# Patient Record
Sex: Female | Born: 1960 | Race: Black or African American | Hispanic: No | Marital: Married | Smoking: Never smoker
Health system: Southern US, Community
[De-identification: ages and names within clinical notes are randomized; demographics above are authoritative.]

## PROBLEM LIST (undated history)

## (undated) DIAGNOSIS — I10 Essential (primary) hypertension: Secondary | ICD-10-CM

## (undated) DIAGNOSIS — E119 Type 2 diabetes mellitus without complications: Secondary | ICD-10-CM

---

## 1998-02-19 ENCOUNTER — Encounter: Admission: RE | Admit: 1998-02-19 | Discharge: 1998-02-19 | Payer: Self-pay | Admitting: Internal Medicine

## 1998-02-25 ENCOUNTER — Encounter: Admission: RE | Admit: 1998-02-25 | Discharge: 1998-02-25 | Payer: Self-pay | Admitting: Hematology and Oncology

## 1998-05-03 ENCOUNTER — Emergency Department (HOSPITAL_COMMUNITY): Admission: EM | Admit: 1998-05-03 | Discharge: 1998-05-03 | Payer: Self-pay | Admitting: Emergency Medicine

## 1998-05-05 ENCOUNTER — Encounter: Admission: RE | Admit: 1998-05-05 | Discharge: 1998-08-03 | Payer: Self-pay

## 1998-07-17 ENCOUNTER — Inpatient Hospital Stay (HOSPITAL_COMMUNITY): Admission: AD | Admit: 1998-07-17 | Discharge: 1998-07-17 | Payer: Self-pay | Admitting: Obstetrics & Gynecology

## 1998-07-18 ENCOUNTER — Ambulatory Visit (HOSPITAL_COMMUNITY): Admission: RE | Admit: 1998-07-18 | Discharge: 1998-07-18 | Payer: Self-pay | Admitting: *Deleted

## 1998-08-07 ENCOUNTER — Encounter: Admission: RE | Admit: 1998-08-07 | Discharge: 1998-08-07 | Payer: Self-pay | Admitting: Obstetrics

## 1998-08-07 ENCOUNTER — Encounter (HOSPITAL_COMMUNITY): Admission: RE | Admit: 1998-08-07 | Discharge: 1998-11-05 | Payer: Self-pay | Admitting: Obstetrics

## 1998-08-21 ENCOUNTER — Encounter: Admission: RE | Admit: 1998-08-21 | Discharge: 1998-08-21 | Payer: Self-pay | Admitting: Obstetrics

## 1998-08-22 ENCOUNTER — Ambulatory Visit (HOSPITAL_COMMUNITY): Admission: RE | Admit: 1998-08-22 | Discharge: 1998-08-22 | Payer: Self-pay | Admitting: *Deleted

## 1998-09-04 ENCOUNTER — Encounter: Admission: RE | Admit: 1998-09-04 | Discharge: 1998-09-04 | Payer: Self-pay | Admitting: Obstetrics

## 1998-09-12 ENCOUNTER — Encounter: Admission: RE | Admit: 1998-09-12 | Discharge: 1998-09-12 | Payer: Self-pay | Admitting: Obstetrics

## 1998-09-18 ENCOUNTER — Encounter: Admission: RE | Admit: 1998-09-18 | Discharge: 1998-09-18 | Payer: Self-pay | Admitting: Obstetrics

## 1998-09-23 ENCOUNTER — Ambulatory Visit (HOSPITAL_COMMUNITY): Admission: RE | Admit: 1998-09-23 | Discharge: 1998-09-23 | Payer: Self-pay | Admitting: *Deleted

## 1998-10-02 ENCOUNTER — Encounter: Admission: RE | Admit: 1998-10-02 | Discharge: 1998-10-02 | Payer: Self-pay | Admitting: Obstetrics

## 1998-10-06 ENCOUNTER — Encounter: Admission: RE | Admit: 1998-10-06 | Discharge: 1999-01-04 | Payer: Self-pay | Admitting: Obstetrics

## 1998-10-16 ENCOUNTER — Encounter: Admission: RE | Admit: 1998-10-16 | Discharge: 1998-10-16 | Payer: Self-pay | Admitting: Obstetrics

## 1998-11-06 ENCOUNTER — Encounter: Admission: RE | Admit: 1998-11-06 | Discharge: 1998-11-06 | Payer: Self-pay | Admitting: Obstetrics

## 1998-11-18 ENCOUNTER — Ambulatory Visit (HOSPITAL_COMMUNITY): Admission: RE | Admit: 1998-11-18 | Discharge: 1998-11-18 | Payer: Self-pay | Admitting: Obstetrics

## 1998-11-27 ENCOUNTER — Encounter (HOSPITAL_COMMUNITY): Admission: RE | Admit: 1998-11-27 | Discharge: 1999-02-09 | Payer: Self-pay | Admitting: Obstetrics

## 1998-11-27 ENCOUNTER — Encounter: Admission: RE | Admit: 1998-11-27 | Discharge: 1998-11-27 | Payer: Self-pay | Admitting: Obstetrics & Gynecology

## 1998-12-06 ENCOUNTER — Inpatient Hospital Stay (HOSPITAL_COMMUNITY): Admission: AD | Admit: 1998-12-06 | Discharge: 1998-12-06 | Payer: Self-pay | Admitting: *Deleted

## 1998-12-11 ENCOUNTER — Encounter: Admission: RE | Admit: 1998-12-11 | Discharge: 1998-12-11 | Payer: Self-pay | Admitting: Obstetrics & Gynecology

## 1998-12-25 ENCOUNTER — Encounter: Admission: RE | Admit: 1998-12-25 | Discharge: 1998-12-25 | Payer: Self-pay | Admitting: Obstetrics

## 1999-01-08 ENCOUNTER — Encounter: Admission: RE | Admit: 1999-01-08 | Discharge: 1999-01-08 | Payer: Self-pay | Admitting: Obstetrics

## 1999-01-22 ENCOUNTER — Inpatient Hospital Stay (HOSPITAL_COMMUNITY): Admission: AD | Admit: 1999-01-22 | Discharge: 1999-01-22 | Payer: Self-pay | Admitting: Obstetrics & Gynecology

## 1999-01-22 ENCOUNTER — Encounter: Admission: RE | Admit: 1999-01-22 | Discharge: 1999-01-22 | Payer: Self-pay | Admitting: Obstetrics

## 1999-01-22 ENCOUNTER — Encounter: Payer: Self-pay | Admitting: Obstetrics & Gynecology

## 1999-01-29 ENCOUNTER — Encounter: Admission: RE | Admit: 1999-01-29 | Discharge: 1999-01-29 | Payer: Self-pay | Admitting: Obstetrics

## 1999-02-05 ENCOUNTER — Inpatient Hospital Stay (HOSPITAL_COMMUNITY): Admission: AD | Admit: 1999-02-05 | Discharge: 1999-02-11 | Payer: Self-pay | Admitting: Obstetrics & Gynecology

## 1999-02-05 ENCOUNTER — Encounter: Admission: RE | Admit: 1999-02-05 | Discharge: 1999-02-05 | Payer: Self-pay | Admitting: Obstetrics

## 1999-02-13 ENCOUNTER — Inpatient Hospital Stay (HOSPITAL_COMMUNITY): Admission: AD | Admit: 1999-02-13 | Discharge: 1999-02-13 | Payer: Self-pay | Admitting: Obstetrics & Gynecology

## 1999-05-11 ENCOUNTER — Encounter: Admission: RE | Admit: 1999-05-11 | Discharge: 1999-05-11 | Payer: Self-pay | Admitting: Internal Medicine

## 1999-05-18 ENCOUNTER — Encounter: Admission: RE | Admit: 1999-05-18 | Discharge: 1999-05-18 | Payer: Self-pay | Admitting: Internal Medicine

## 1999-10-22 ENCOUNTER — Encounter: Admission: RE | Admit: 1999-10-22 | Discharge: 1999-10-22 | Payer: Self-pay | Admitting: Internal Medicine

## 2000-04-14 ENCOUNTER — Encounter: Admission: RE | Admit: 2000-04-14 | Discharge: 2000-04-14 | Payer: Self-pay | Admitting: Obstetrics

## 2000-09-05 ENCOUNTER — Encounter: Admission: RE | Admit: 2000-09-05 | Discharge: 2000-09-05 | Payer: Self-pay | Admitting: Internal Medicine

## 2000-09-05 ENCOUNTER — Ambulatory Visit (HOSPITAL_COMMUNITY): Admission: RE | Admit: 2000-09-05 | Discharge: 2000-09-05 | Payer: Self-pay | Admitting: Internal Medicine

## 2001-01-16 ENCOUNTER — Encounter: Admission: RE | Admit: 2001-01-16 | Discharge: 2001-01-16 | Payer: Self-pay

## 2001-03-06 ENCOUNTER — Encounter: Admission: RE | Admit: 2001-03-06 | Discharge: 2001-03-06 | Payer: Self-pay | Admitting: Internal Medicine

## 2001-03-30 ENCOUNTER — Encounter: Payer: Self-pay | Admitting: Internal Medicine

## 2001-03-30 ENCOUNTER — Encounter: Admission: RE | Admit: 2001-03-30 | Discharge: 2001-03-30 | Payer: Self-pay | Admitting: Internal Medicine

## 2001-03-30 ENCOUNTER — Ambulatory Visit (HOSPITAL_COMMUNITY): Admission: RE | Admit: 2001-03-30 | Discharge: 2001-03-30 | Payer: Self-pay | Admitting: Internal Medicine

## 2002-04-03 ENCOUNTER — Encounter: Admission: RE | Admit: 2002-04-03 | Discharge: 2002-04-03 | Payer: Self-pay | Admitting: Internal Medicine

## 2002-05-15 ENCOUNTER — Encounter: Admission: RE | Admit: 2002-05-15 | Discharge: 2002-05-15 | Payer: Self-pay | Admitting: Internal Medicine

## 2003-02-18 ENCOUNTER — Ambulatory Visit (HOSPITAL_COMMUNITY): Admission: RE | Admit: 2003-02-18 | Discharge: 2003-02-18 | Payer: Self-pay | Admitting: Obstetrics

## 2003-02-18 ENCOUNTER — Encounter: Payer: Self-pay | Admitting: Obstetrics

## 2016-02-09 ENCOUNTER — Other Ambulatory Visit: Payer: Self-pay | Admitting: Family

## 2016-02-09 DIAGNOSIS — Z1231 Encounter for screening mammogram for malignant neoplasm of breast: Secondary | ICD-10-CM

## 2016-02-17 ENCOUNTER — Ambulatory Visit
Admission: RE | Admit: 2016-02-17 | Discharge: 2016-02-17 | Disposition: A | Discharge status: Home / Self Care | Payer: BLUE CROSS/BLUE SHIELD | Source: Ambulatory Visit | Attending: Family | Admitting: Family

## 2016-02-17 DIAGNOSIS — Z1231 Encounter for screening mammogram for malignant neoplasm of breast: Secondary | ICD-10-CM

## 2016-07-14 ENCOUNTER — Encounter (INDEPENDENT_AMBULATORY_CARE_PROVIDER_SITE_OTHER): Payer: Self-pay

## 2017-02-25 ENCOUNTER — Other Ambulatory Visit: Payer: Self-pay | Admitting: Family

## 2017-02-25 DIAGNOSIS — Z1231 Encounter for screening mammogram for malignant neoplasm of breast: Secondary | ICD-10-CM

## 2017-03-18 ENCOUNTER — Ambulatory Visit
Admission: RE | Admit: 2017-03-18 | Discharge: 2017-03-18 | Disposition: A | Discharge status: Home / Self Care | Payer: BLUE CROSS/BLUE SHIELD | Source: Ambulatory Visit | Attending: Family | Admitting: Family

## 2017-03-18 DIAGNOSIS — Z1231 Encounter for screening mammogram for malignant neoplasm of breast: Secondary | ICD-10-CM

## 2017-07-29 ENCOUNTER — Other Ambulatory Visit: Payer: Self-pay | Admitting: Family Medicine

## 2017-07-29 ENCOUNTER — Other Ambulatory Visit (HOSPITAL_COMMUNITY)
Admission: RE | Admit: 2017-07-29 | Discharge: 2017-07-29 | Disposition: A | Discharge status: Home / Self Care | Payer: BLUE CROSS/BLUE SHIELD | Source: Ambulatory Visit | Attending: Family Medicine | Admitting: Family Medicine

## 2017-07-29 DIAGNOSIS — Z124 Encounter for screening for malignant neoplasm of cervix: Secondary | ICD-10-CM | POA: Diagnosis not present

## 2017-08-02 LAB — CYTOLOGY - PAP
DIAGNOSIS: UNDETERMINED — AB
HPV (WINDOPATH): NOT DETECTED

## 2017-10-17 ENCOUNTER — Ambulatory Visit (INDEPENDENT_AMBULATORY_CARE_PROVIDER_SITE_OTHER): Payer: BLUE CROSS/BLUE SHIELD

## 2017-10-17 ENCOUNTER — Ambulatory Visit (HOSPITAL_COMMUNITY)
Admission: EM | Admit: 2017-10-17 | Discharge: 2017-10-17 | Disposition: A | Discharge status: Home / Self Care | Payer: BLUE CROSS/BLUE SHIELD | Attending: Family Medicine | Admitting: Family Medicine

## 2017-10-17 ENCOUNTER — Other Ambulatory Visit: Payer: Self-pay

## 2017-10-17 ENCOUNTER — Encounter (HOSPITAL_COMMUNITY): Payer: Self-pay | Admitting: Emergency Medicine

## 2017-10-17 DIAGNOSIS — S338XXA Sprain of other parts of lumbar spine and pelvis, initial encounter: Secondary | ICD-10-CM | POA: Diagnosis not present

## 2017-10-17 HISTORY — DX: Essential (primary) hypertension: I10

## 2017-10-17 HISTORY — DX: Type 2 diabetes mellitus without complications: E11.9

## 2017-10-17 MED ORDER — NAPROXEN 375 MG PO TABS: 375.0000 mg | ORAL_TABLET | Freq: Two times a day (BID) | ORAL | 0 refills | Status: AC

## 2017-10-17 NOTE — ED Provider Notes (Signed)
[facility]    CSN: [id number] Arrival date & time: 10/17/17  1236     History   Chief Complaint Chief Complaint  Patient presents with  . Abdominal Pain    HPI Kerby Noraheodora Henry-Reichow is a 56 y.o. female.   56 year old moderate to severe obesity complaining of pain after having a bowel movement. The pain is located primarily in the saddle area/genital perineal anal area. Arising from a seated position she has pain in the same area difficult to walk. Denies trauma. Denies fall or other injury. She states her job requires her to stand for several hours during the day working for [employer].      Past Medical History:  Diagnosis Date  . Diabetes mellitus without complication (HCC)   . Hypertension     There are no active problems to display for this patient.   Past Surgical History:  Procedure Laterality Date  . CESAREAN SECTION      OB History    No data available       Home Medications    Prior to Admission medications   Medication Sig Start Date End Date Taking? Authorizing Provider  amLODipine (NORVASC) 10 MG tablet Take 10 mg by mouth daily.   Yes [provider]  METFORMIN HCL PO Take by mouth.   Yes [provider]  naproxen (NAPROSYN) 375 MG tablet Take 1 tablet (375 mg total) by mouth 2 (two) times daily. 10/17/17   Hayden RasmussenMabe, Ellionna Buckbee, NP    Family History Family History  Problem Relation Age of Onset  . Stroke Mother   . Heart failure Mother   . Diabetes Mother   . Cancer Father   . Diabetes Child   . Breast cancer Neg Hx     Social History Social History   Tobacco Use  . Smoking status: Never Smoker  Substance Use Topics  . Alcohol use: No    Frequency: Never  . Drug use: Not on file     Allergies   Patient has no known allergies.   Review of Systems Review of Systems  Constitutional: Positive for activity change. Negative for chills and fever.  HENT: Negative.   Respiratory: Negative.   Cardiovascular:  Negative.   Genitourinary: Negative for dysuria.  Musculoskeletal: Positive for myalgias.       As per HPI  Skin: Negative for color change, pallor and rash.  Neurological: Positive for weakness.  All other systems reviewed and are negative.    Physical Exam Triage Vital Signs ED Triage Vitals [10/17/17 1341]  Enc Vitals Group     BP (!) 172/93     Pulse Rate 90     Resp 18     Temp 98.2 F (36.8 C)     Temp src      SpO2 100 %     Weight      Height      Head Circumference      Peak Flow      Pain Score 5     Pain Loc      Pain Edu?      Excl. in GC?    No data found.  Updated Vital Signs BP (!) 172/93   Pulse 90   Temp 98.2 F (36.8 C)   Resp 18   SpO2 100%   Visual Acuity Right Eye Distance:   Left Eye Distance:   Bilateral Distance:    Right Eye Near:   Left Eye Near:    Bilateral  Near:     Physical Exam  Constitutional: She is oriented to person, place, and time. She appears well-developed and well-nourished. No distress.  HENT:  Head: Normocephalic and atraumatic.  Eyes: EOM are normal.  Neck: Normal range of motion. Neck supple.  Cardiovascular: Normal rate and regular rhythm.  Pulmonary/Chest: Effort normal and breath sounds normal.  Abdominal:  Obese, soft, nontender. No rebound or guarding.  Genitourinary:  Genitourinary Comments: Christina, R and present Patient was placed in lithotomy position. Palpation of the initial bones produces some tenderness. No tenderness to the abductor muscles. Having the patient roll over to the left lateral recumbency position and palpating the deep bony or structures also produces minor discomfort/tenderness. Difficult to evaluate due to body habitus.  Lymphadenopathy:    She has no cervical adenopathy.  Neurological: She is alert and oriented to person, place, and time. No cranial nerve deficit.  Skin: Skin is warm and dry.  Nursing note and vitals reviewed.    UC Treatments / Results  Labs (all labs  ordered are listed, but only abnormal results are displayed) Labs Reviewed - No data to display  EKG  EKG Interpretation None       Radiology Dg Pelvis 1-2 Views  Result Date: 10/17/2017 CLINICAL DATA:  Sudden onset mid pelvic pain, worse with movement. EXAM: PELVIS - 1-2 VIEW COMPARISON:  None. FINDINGS: There is no evidence of pelvic fracture or diastasis. Possible erosive changes along the left lateral acetabulum and lateral femoral head. Degenerative changes of the sacroiliac joints, pubic symphysis, and bilateral hip joints. IMPRESSION: Possible erosive changes along the left lateral acetabulum and lateral femoral head. Recommend dedicated left hip x-rays for further evaluation. Electronically Signed   By: Obie DredgeWilliam T Derry M.D.   On: 10/17/2017 14:51    Procedures Procedures (including critical care time)  Medications Ordered in UC Medications - No data to display   Initial Impression / Assessment and Plan / UC Course  I have reviewed the triage vital signs and the nursing notes.  Pertinent labs & imaging results that were available during my care of the patient were reviewed by me and considered in my medical decision making (see chart for details).   likely a pelvic ligament strain. I recommended limited work with [employer]. Persistent walking and/or lifting may exacerbate symptoms. Apply ice initially.    Final Clinical Impressions(s) / UC Diagnoses   Final diagnoses:  Pelvic sprain, initial encounter    ED Discharge Orders        Ordered    naproxen (NAPROSYN) 375 MG tablet  2 times daily     10/17/17 1515       Controlled Substance Prescriptions [place] Controlled Substance Registry consulted? Not Applicable   Hayden RasmussenMabe, Aireana Ryland, NP 10/17/17 1517

## 2017-10-17 NOTE — Discharge Instructions (Signed)
Ice for first 2-3 days, then heat. Medication as directed

## 2017-10-17 NOTE — ED Triage Notes (Signed)
Pt states she had a painful BM, soft stool today, afterwards she started having lower pelvic pain. Denies issues with urination.

## 2018-01-17 ENCOUNTER — Encounter (HOSPITAL_COMMUNITY): Payer: Self-pay | Admitting: Emergency Medicine

## 2018-01-17 ENCOUNTER — Ambulatory Visit (HOSPITAL_COMMUNITY)
Admission: EM | Admit: 2018-01-17 | Discharge: 2018-01-17 | Disposition: A | Discharge status: Home / Self Care | Payer: BLUE CROSS/BLUE SHIELD | Attending: Family Medicine | Admitting: Family Medicine

## 2018-01-17 DIAGNOSIS — N930 Postcoital and contact bleeding: Secondary | ICD-10-CM

## 2018-01-17 DIAGNOSIS — N941 Unspecified dyspareunia: Secondary | ICD-10-CM

## 2018-01-17 DIAGNOSIS — Z113 Encounter for screening for infections with a predominantly sexual mode of transmission: Secondary | ICD-10-CM

## 2018-01-17 DIAGNOSIS — E119 Type 2 diabetes mellitus without complications: Secondary | ICD-10-CM | POA: Insufficient documentation

## 2018-01-17 DIAGNOSIS — I10 Essential (primary) hypertension: Secondary | ICD-10-CM | POA: Diagnosis not present

## 2018-01-17 DIAGNOSIS — Z202 Contact with and (suspected) exposure to infections with a predominantly sexual mode of transmission: Secondary | ICD-10-CM | POA: Diagnosis present

## 2018-01-17 DIAGNOSIS — Z79899 Other long term (current) drug therapy: Secondary | ICD-10-CM | POA: Diagnosis not present

## 2018-01-17 DIAGNOSIS — Z7984 Long term (current) use of oral hypoglycemic drugs: Secondary | ICD-10-CM | POA: Insufficient documentation

## 2018-01-17 NOTE — ED Provider Notes (Signed)
[facility]    CSN: [id number] Arrival date & time: 01/17/18  1752     History   Chief Complaint Chief Complaint  Patient presents with  . Exposure to STD    HPI Jill Rich is a 57 y.o. female.   57 year old female with history of HTN, DM comes in for STD screening.  States that her partner experience some "tingling sensation" of the lower abdomen, and is worried about STDs and requested her to come in.  She is currently asymptomatic.  Denies fever, chills, night sweats.  Denies abdominal pain, nausea, vomiting.  Denies urinary symptoms such as frequency, dysuria, hematuria.  Denies vaginal discharge, itching/pain, spotting.  She has been postmenopausal for many years.  She is sexually active with one female partner, with condom use. States had some bleeding and pain after intercourse for the first time in 10 years, which has since resolved. States partner worries that that is a sign of STD.       Past Medical History:  Diagnosis Date  . Diabetes mellitus without complication (HCC)   . Hypertension     There are no active problems to display for this patient.   Past Surgical History:  Procedure Laterality Date  . CESAREAN SECTION      OB History    No data available       Home Medications    Prior to Admission medications   Medication Sig Start Date End Date Taking? Authorizing Provider  amLODipine (NORVASC) 10 MG tablet Take 10 mg by mouth daily.    [provider]  METFORMIN HCL PO Take by mouth.    [provider]  naproxen (NAPROSYN) 375 MG tablet Take 1 tablet (375 mg total) by mouth 2 (two) times daily. 10/17/17   Hayden Rasmussen, NP    Family History Family History  Problem Relation Age of Onset  . Stroke Mother   . Heart failure Mother   . Diabetes Mother   . Cancer Father   . Diabetes Child   . Breast cancer Neg Hx     Social History Social History   Tobacco Use  . Smoking status: Never Smoker  Substance  Use Topics  . Alcohol use: No    Frequency: Never  . Drug use: Not on file     Allergies   Patient has no known allergies.   Review of Systems Review of Systems  Reason unable to perform ROS: See HPI as above.     Physical Exam Triage Vital Signs ED Triage Vitals [01/17/18 1814]  Enc Vitals Group     BP (!) 163/95     Pulse Rate 89     Resp 18     Temp 98.1 F (36.7 C)     Temp Source Oral     SpO2 100 %     Weight      Height      Head Circumference      Peak Flow      Pain Score 0     Pain Loc      Pain Edu?      Excl. in GC?    No data found.  Updated Vital Signs BP (!) 163/95 (BP Location: Left Arm)   Pulse 89   Temp 98.1 F (36.7 C) (Oral)   Resp 18   SpO2 100%   Physical Exam  Constitutional: She is oriented to person, place, and time. She appears well-developed and well-nourished. No distress.  HENT:  Head: Normocephalic and atraumatic.  Eyes: Conjunctivae are normal. Pupils are equal, round, and reactive to light.  Neurological: She is alert and oriented to person, place, and time.     UC Treatments / Results  Labs (all labs ordered are listed, but only abnormal results are displayed) Labs Reviewed  CERVICOVAGINAL ANCILLARY ONLY    EKG  EKG Interpretation None       Radiology No results found.  Procedures Procedures (including critical care time)  Medications Ordered in UC Medications - No data to display   Initial Impression / Assessment and Plan / UC Course  I have reviewed the triage vital signs and the nursing notes.  Pertinent labs & imaging results that were available during my care of the patient were reviewed by me and considered in my medical decision making (see chart for details).    Cytology sent, patient will be contacted with any positive results that require additional treatment. Patient to refrain from sexual activity for the next 7 days. Return precautions given.    Final Clinical Impressions(s) / UC  Diagnoses   Final diagnoses:  Screen for STD (sexually transmitted disease)    ED Discharge Orders    None        Belinda FisherYu, Amy V, PA-C 01/17/18 1845

## 2018-01-17 NOTE — ED Triage Notes (Signed)
Pt here requesting STD screen; denies sx

## 2018-01-17 NOTE — Discharge Instructions (Addendum)
Cytology sent, you will be contacted with any positive results that requires further treatment. Refrain from sexual activity and alcohol use for the next 7 days. Monitor for any worsening of symptoms, fever, abdominal pain, nausea, vomiting, to follow up for reevaluation. ° °

## 2018-01-18 LAB — CERVICOVAGINAL ANCILLARY ONLY
Bacterial vaginitis: NEGATIVE
CANDIDA VAGINITIS: NEGATIVE
CHLAMYDIA, DNA PROBE: NEGATIVE
NEISSERIA GONORRHEA: NEGATIVE
Trichomonas: NEGATIVE

## 2018-02-21 ENCOUNTER — Encounter (HOSPITAL_COMMUNITY): Payer: Self-pay | Admitting: Emergency Medicine

## 2018-02-21 ENCOUNTER — Ambulatory Visit (HOSPITAL_COMMUNITY)
Admission: EM | Admit: 2018-02-21 | Discharge: 2018-02-21 | Disposition: A | Discharge status: Home / Self Care | Payer: BLUE CROSS/BLUE SHIELD | Attending: Family Medicine | Admitting: Family Medicine

## 2018-02-21 DIAGNOSIS — M7918 Myalgia, other site: Secondary | ICD-10-CM

## 2018-02-21 DIAGNOSIS — R062 Wheezing: Secondary | ICD-10-CM

## 2018-02-21 MED ORDER — PREDNISONE 10 MG (21) PO TBPK: ORAL_TABLET | Freq: Every day | ORAL | 0 refills | Status: AC

## 2018-02-21 NOTE — ED Provider Notes (Signed)
[facility]   [id number] 02/21/18 Arrival Time: 1001  ASSESSMENT & PLAN:  1. Musculoskeletal pain   2. Wheezing     Meds ordered this encounter  Medications  . predniSONE (STERAPRED UNI-PAK 21 TAB) 10 MG (21) TBPK tablet    Sig: Take by mouth daily. Take as directed.    Dispense:  21 tablet    Refill:  0   Short trial of prednisone. Will watch her blood sugars. Reports they are usually controlled. Discussed her MSK complaints may be weight related. She plans to f/u with PCP to discuss weight loss and possibly a nutritionist referral. May f/u here as needed.  Wheezing likely related to seasonal allergies. May try OTC allergy med daily if she desires.  Reviewed expectations re: course of current medical issues. Questions answered. Outlined signs and symptoms indicating need for more acute intervention. Patient verbalized understanding. After Visit Summary given.  SUBJECTIVE: History from: patient. Jill Rich is a 57 y.o. female who reports intermittent mild to moderate pain of her knees, pelvis, groin described as aching. No specific radiation from areas of discomfort. Onset: gradual, several months ago. Injury/trama: no. Relieved by: nothing in particular. "Just comes on randomly." Works at [employer] and notices more after working. Worsened by: certain movements when present. Associated symptoms: none reported. Extremity sensation changes or weakness: none. Self treatment: occasional Tylenol that helps. No abdominal pain or urinary symptoms.  Also thinks she is wheezing. On/off over the past week. Nasal congestion and sneezing. Afebrile. No SOB. Seems worse at night. No OTC treatment.  ROS: As per HPI.   OBJECTIVE:  Vitals:   02/21/18 1018  BP: (!) 164/93  Pulse: 79  Resp: 16  Temp: 97.6 F (36.4 C)  TempSrc: Oral  SpO2: 99%    General appearance: alert; no distress; morbid obesity Extremities: no cyanosis or edema; symmetrical with no gross  deformities; no pain with extremity movements while in office today. LE ROM: normal. Abd: obese which limits exam CV: normal extremity capillary refill Skin: warm and dry Neurologic: normal gait; normal symmetric reflexes in all extremities; normal sensation in all extremities Psychological: alert and cooperative; normal mood and affect  No Known Allergies  Past Medical History:  Diagnosis Date  . Diabetes mellitus without complication (HCC)   . Hypertension    Social History   Socioeconomic History  . Marital status: Married    Spouse name: Not on file  . Number of children: Not on file  . Years of education: Not on file  . Highest education level: Not on file  Occupational History  . Not on file  Social Needs  . Financial resource strain: Not on file  . Food insecurity:    Worry: Not on file    Inability: Not on file  . Transportation needs:    Medical: Not on file    Non-medical: Not on file  Tobacco Use  . Smoking status: Never Smoker  Substance and Sexual Activity  . Alcohol use: No    Frequency: Never  . Drug use: Not on file  . Sexual activity: Not on file  Lifestyle  . Physical activity:    Days per week: Not on file    Minutes per session: Not on file  . Stress: Not on file  Relationships  . Social connections:    Talks on phone: Not on file    Gets together: Not on file    Attends religious service: Not on file    Active member  of club or organization: Not on file    Attends meetings of clubs or organizations: Not on file    Relationship status: Not on file  . Intimate partner violence:    Fear of current or ex partner: Not on file    Emotionally abused: Not on file    Physically abused: Not on file    Forced sexual activity: Not on file  Other Topics Concern  . Not on file  Social History Narrative  . Not on file   Family History  Problem Relation Age of Onset  . Stroke Mother   . Heart failure Mother   . Diabetes Mother   . Cancer Father    . Diabetes Child   . Breast cancer Neg Hx    Past Surgical History:  Procedure Laterality Date  . CESAREAN SECTION        Mardella LaymanHagler, Azyria Osmon, MD 02/21/18 [phone]

## 2018-02-21 NOTE — ED Triage Notes (Signed)
Pt here for groin pain and URI sx

## 2018-03-21 ENCOUNTER — Other Ambulatory Visit: Payer: Self-pay | Admitting: Family Medicine

## 2018-03-21 DIAGNOSIS — Z1231 Encounter for screening mammogram for malignant neoplasm of breast: Secondary | ICD-10-CM

## 2018-03-22 ENCOUNTER — Ambulatory Visit
Admission: RE | Admit: 2018-03-22 | Discharge: 2018-03-22 | Disposition: A | Discharge status: Home / Self Care | Payer: BLUE CROSS/BLUE SHIELD | Source: Ambulatory Visit | Attending: Family Medicine | Admitting: Family Medicine

## 2018-03-22 DIAGNOSIS — Z1231 Encounter for screening mammogram for malignant neoplasm of breast: Secondary | ICD-10-CM

## 2018-04-17 ENCOUNTER — Other Ambulatory Visit: Payer: Self-pay | Admitting: Family Medicine

## 2018-04-17 ENCOUNTER — Ambulatory Visit
Admission: RE | Admit: 2018-04-17 | Discharge: 2018-04-17 | Disposition: A | Discharge status: Home / Self Care | Payer: BLUE CROSS/BLUE SHIELD | Source: Ambulatory Visit | Attending: Family Medicine | Admitting: Family Medicine

## 2018-04-17 DIAGNOSIS — R053 Chronic cough: Secondary | ICD-10-CM

## 2018-04-17 DIAGNOSIS — R05 Cough: Secondary | ICD-10-CM

## 2018-07-02 ENCOUNTER — Emergency Department (HOSPITAL_COMMUNITY): Payer: BLUE CROSS/BLUE SHIELD

## 2018-07-02 ENCOUNTER — Encounter (HOSPITAL_COMMUNITY): Payer: Self-pay | Admitting: Oncology

## 2018-07-02 ENCOUNTER — Other Ambulatory Visit: Payer: Self-pay

## 2018-07-02 ENCOUNTER — Emergency Department (HOSPITAL_COMMUNITY)
Admission: EM | Admit: 2018-07-02 | Discharge: 2018-07-02 | Disposition: A | Discharge status: Home / Self Care | Payer: BLUE CROSS/BLUE SHIELD | Attending: Emergency Medicine | Admitting: Emergency Medicine

## 2018-07-02 DIAGNOSIS — Z7984 Long term (current) use of oral hypoglycemic drugs: Secondary | ICD-10-CM | POA: Diagnosis not present

## 2018-07-02 DIAGNOSIS — Z7982 Long term (current) use of aspirin: Secondary | ICD-10-CM | POA: Diagnosis not present

## 2018-07-02 DIAGNOSIS — I10 Essential (primary) hypertension: Secondary | ICD-10-CM | POA: Diagnosis not present

## 2018-07-02 DIAGNOSIS — M160 Bilateral primary osteoarthritis of hip: Secondary | ICD-10-CM | POA: Insufficient documentation

## 2018-07-02 DIAGNOSIS — M25559 Pain in unspecified hip: Secondary | ICD-10-CM | POA: Diagnosis present

## 2018-07-02 DIAGNOSIS — E119 Type 2 diabetes mellitus without complications: Secondary | ICD-10-CM | POA: Diagnosis not present

## 2018-07-02 DIAGNOSIS — Z79899 Other long term (current) drug therapy: Secondary | ICD-10-CM | POA: Diagnosis not present

## 2018-07-02 MED ORDER — TRAMADOL HCL 50 MG PO TABS: 50.0000 mg | ORAL_TABLET | Freq: Four times a day (QID) | ORAL | 0 refills | Status: AC | PRN

## 2018-07-02 MED ORDER — IBUPROFEN 800 MG PO TABS: 800.0000 mg | ORAL_TABLET | Freq: Three times a day (TID) | ORAL | 0 refills | Status: DC | PRN

## 2018-07-02 MED ORDER — KETOROLAC TROMETHAMINE 60 MG/2ML IM SOLN
60.0000 mg | Freq: Once | INTRAMUSCULAR | Status: AC
  Administered 2018-07-02: 60 mg via INTRAMUSCULAR
  Filled 2018-07-02: qty 2

## 2018-07-02 MED ORDER — TRAMADOL HCL 50 MG PO TABS
50.0000 mg | ORAL_TABLET | Freq: Once | ORAL | Status: AC
  Administered 2018-07-02: 50 mg via ORAL
  Filled 2018-07-02: qty 1

## 2018-07-02 NOTE — Discharge Instructions (Signed)
Return here as needed.  Follow-up with the orthopedic provided.

## 2018-07-02 NOTE — ED Provider Notes (Signed)
[facility] EMERGENCY DEPARTMENT Provider Note   CSN: [id number] Arrival date & time: 07/02/18  0321     History   Chief Complaint Chief Complaint  Patient presents with  . Pelvic Pain    HPI Jill Rich is a 57 y.o. female.  HPI Patient presents to the emergency department with his hip and groin pain that started 2 years ago and she said several flareups of this pain during that timeframe.  She states that tonight she was unable to sleep due to the discomfort.  She states that certain movements make the pain worse.  She states that she was told that she does have arthritis in both hips that is pretty significant.  Patient states that she does not have any back pain.  Patient states she does not lose bowel or bladder function with any of the symptoms.  The patient denies chest pain, shortness of breath, headache,blurred vision, neck pain, fever, cough, weakness, numbness, dizziness, anorexia, edema, abdominal pain, nausea, vomiting, diarrhea, rash, back pain, dysuria, hematemesis, bloody stool, near syncope, or syncope. Past Medical History:  Diagnosis Date  . Diabetes mellitus without complication (HCC)   . Hypertension     There are no active problems to display for this patient.   Past Surgical History:  Procedure Laterality Date  . CESAREAN SECTION       OB History   None      Home Medications    Prior to Admission medications   Medication Sig Start Date End Date Taking? Authorizing Provider  albuterol (PROVENTIL HFA;VENTOLIN HFA) 108 (90 Base) MCG/ACT inhaler Inhale 1-2 puffs into the lungs every 6 (six) hours as needed for wheezing or shortness of breath.   Yes [provider]  amLODipine (NORVASC) 10 MG tablet Take 10 mg by mouth daily.   Yes [provider]  aspirin EC 81 MG tablet Take 81 mg by mouth daily.   Yes [provider]  carvedilol (COREG) 25 MG tablet Take 25 mg by mouth 2 (two) times daily with a  meal.   Yes [provider]  metFORMIN (GLUCOPHAGE) 500 MG tablet Take 1,000 mg by mouth 2 (two) times daily with a meal.   Yes [provider]  Multiple Vitamin (MULTIVITAMIN WITH MINERALS) TABS tablet Take 1 tablet by mouth daily.   Yes [provider]  triamterene-hydrochlorothiazide (MAXZIDE-25) 37.5-25 MG tablet Take 1 tablet by mouth daily.   Yes [provider]  naproxen (NAPROSYN) 375 MG tablet Take 1 tablet (375 mg total) by mouth 2 (two) times daily. Patient not taking: Reported on 07/02/2018 10/17/17   Hayden Rasmussen, NP  predniSONE (STERAPRED UNI-PAK 21 TAB) 10 MG (21) TBPK tablet Take by mouth daily. Take as directed. Patient not taking: Reported on 07/02/2018 02/21/18   Mardella Layman, MD    Family History Family History  Problem Relation Age of Onset  . Stroke Mother   . Heart failure Mother   . Diabetes Mother   . Cancer Father   . Diabetes Child   . Breast cancer Neg Hx     Social History Social History   Tobacco Use  . Smoking status: Never Smoker  . Smokeless tobacco: Never Used  Substance Use Topics  . Alcohol use: No    Frequency: Never  . Drug use: Not on file     Allergies   Patient has no known allergies.   Review of Systems Review of Systems  All other systems negative except as documented  in the HPI. All pertinent positives and negatives as reviewed in the HPI. Physical Exam Updated Vital Signs BP 129/78   Pulse 85   Temp 98.5 F (36.9 C) (Oral)   Resp 19   Ht 5\' 5"  (1.651 m)   SpO2 100%   Physical Exam  Constitutional: She is oriented to person, place, and time. She appears well-developed and well-nourished. No distress.  HENT:  Head: Normocephalic and atraumatic.  Mouth/Throat: Oropharynx is clear and moist.  Eyes: Pupils are equal, round, and reactive to light.  Neck: Normal range of motion. Neck supple.  Cardiovascular: Normal rate, regular rhythm and normal heart sounds. Exam reveals no gallop and no  friction rub.  No murmur heard. Pulmonary/Chest: Effort normal and breath sounds normal. No respiratory distress. She has no wheezes.  Abdominal: Soft. Bowel sounds are normal. She exhibits no distension. There is no tenderness.  Musculoskeletal:       Right hip: She exhibits tenderness. She exhibits normal range of motion, normal strength, no bony tenderness, no swelling and no deformity.       Left hip: She exhibits tenderness. She exhibits normal range of motion, normal strength and no bony tenderness.  Neurological: She is alert and oriented to person, place, and time. She exhibits normal muscle tone. Coordination normal.  Skin: Skin is warm and dry. Capillary refill takes less than 2 seconds. No rash noted. No erythema.  Psychiatric: She has a normal mood and affect. Her behavior is normal.  Nursing note and vitals reviewed.    ED Treatments / Results  Labs (all labs ordered are listed, but only abnormal results are displayed) Labs Reviewed - No data to display  EKG None  Radiology Dg Pelvis 1-2 Views  Result Date: 07/02/2018 CLINICAL DATA:  Pelvic pain for 1 year which acutely worsened last night. No known injury. EXAM: PELVIS - 1-2 VIEW COMPARISON:  Single-view of the pelvis 10/17/2017. FINDINGS: No acute bony or joint abnormality is identified. Moderate bilateral hip osteoarthritis appears advanced for age. There is also degenerative disease about the symphysis pubis and SI joints. No focal bony lesion is identified. Soft tissues appear normal. IMPRESSION: No acute abnormality or change compared to the prior exam. Advanced for age appearing bilateral hip osteoarthritis. Degenerative disease about the SI joints and symphysis pubis also noted. Electronically Signed   By: Drusilla Kanner M.D.   On: 07/02/2018 07:52    Procedures Procedures (including critical care time)  Medications Ordered in ED Medications  ketorolac (TORADOL) injection 60 mg (60 mg Intramuscular Given 07/02/18  0732)  traMADol (ULTRAM) tablet 50 mg (50 mg Oral Given 07/02/18 0732)     Initial Impression / Assessment and Plan / ED Course  I have reviewed the triage vital signs and the nursing notes.  Pertinent labs & imaging results that were available during my care of the patient were reviewed by me and considered in my medical decision making (see chart for details).    Patient may have a lower back component but no neurological deficits noted on exam.  She has normal reflexes and normal gait.  She does have pain in the hips with movements.  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  Advised the patient she will need to follow-up with orthopedics told to return here as needed.  Patient agrees the plan and all questions were answered.  Final Clinical Impressions(s) / ED Diagnoses   Final diagnoses:  None    ED Discharge Orders    None  Charlestine Night, PA-C 07/02/18 0814    Melene Plan, DO 07/02/18 [phone]

## 2018-07-02 NOTE — ED Notes (Signed)
Patient transported to X-ray 

## 2018-07-02 NOTE — ED Triage Notes (Signed)
Pt reports pelvic/groin pain x 1 year.  States tonight it got progressively worse. Pt has been seen on one other occasion for similar pain and was told it was arthritis.  Pt states the pain is so bad it is interfering with ambulation.

## 2018-07-13 ENCOUNTER — Ambulatory Visit (INDEPENDENT_AMBULATORY_CARE_PROVIDER_SITE_OTHER): Payer: BLUE CROSS/BLUE SHIELD | Admitting: Orthopaedic Surgery

## 2018-07-13 ENCOUNTER — Encounter (INDEPENDENT_AMBULATORY_CARE_PROVIDER_SITE_OTHER): Payer: Self-pay | Admitting: Orthopaedic Surgery

## 2018-07-13 DIAGNOSIS — R102 Pelvic and perineal pain: Secondary | ICD-10-CM

## 2018-07-13 MED ORDER — TIZANIDINE HCL 4 MG PO TABS: 4.0000 mg | ORAL_TABLET | Freq: Three times a day (TID) | ORAL | 0 refills | Status: AC | PRN

## 2018-07-13 NOTE — Progress Notes (Signed)
Office Visit Note   Patient: Jill Rich           Date of Birth: 05-17-1961           MRN: [id number] Visit Date: 07/13/2018              Requested by: No referring provider defined for this encounter. PCP: System, Provider Not In   Assessment & Plan: Visit Diagnoses:  1. Pelvic pain     Plan: At this point to me her plain films do not show severe arthritis of her hips.  Her exam does show significant pelvic pain but not so much of the hip joint itself.  My lateral pain is in the groin and pubic symphysis area.  At this point given the fact that she is been in the ER multiple times for complaints of this type of pain from an orthopedic standpoint I would like to obtain an MRI of her pelvis to assess the cartilage and other structures around the pubis and both hips.  She is apparently seeing some type of clinic tomorrow where there is a nutritionist.  She does not know what her blood glucose levels are.  She is significantly obese and that she needs to work on weight loss.  She is had a chronic cough and multiple aches and pains of all her joints.  I recommended she also see her GYN physician as well.  I will at least try small muscle relaxant and have her continue ibuprofen.  When she has the MRI scheduled she will call us for follow-up appointment.  Follow-Up Instructions: The patient will call for follow-up appointment what she knows the date of her MRI.  Orders:  No orders of the defined types were placed in this encounter.  Meds ordered this encounter  Medications  . tiZANidine (ZANAFLEX) 4 MG tablet    Sig: Take 1 tablet (4 mg total) by mouth every 8 (eight) hours as needed for muscle spasms.    Dispense:  40 tablet    Refill:  0      Procedures: No procedures performed   Clinical Data: No additional findings.   Subjective: Chief Complaint  Patient presents with  . Right Hip - Pain  . Left Hip - Pain  The patient is referred from the emergency room where  she has been several times due to some type of vague pelvic and groin pain.  The ER sent her this way because on the radiology report of her hips they said that there was some mild arthritic changes.  The patient comes in today saying that she is overweight and she does not know her weight.  She is a diabetic but does not want know what her blood sugars are running.  She has had a chronic cough and hurts in all of her joints.  She said her pain is more in the pubis and groin area.  She feels weak overall.  HPI  Review of Systems She currently denies any fever or chills.  She does report sometimes shortness of breath and occasional chest pain.  Objective: Vital Signs: There were no vitals taken for this visit.  Physical Exam She is alert and oriented in no acute distress but is obviously obese. Ortho Exam Examination of both hips show that the move fluidly with internal and external rotation with no severe pain at all.  Her pain seems to be more on the pubis and groin area than it does actually hip joint itself. Specialty  Comments:  No specialty comments available.  Imaging: No results found. Independent review of her pelvis films from December of last year and then September of this year showed just some very mild arthritic changes of the left and right hips but is very mild.  The joint space still well-maintained and it does not appear that she has moderate to severe arthritis but I would call this more just mild arthritis.  PMFS History: There are no active problems to display for this patient.  Past Medical History:  Diagnosis Date  . Diabetes mellitus without complication (HCC)   . Hypertension     Family History  Problem Relation Age of Onset  . Stroke Mother   . Heart failure Mother   . Diabetes Mother   . Cancer Father   . Diabetes Child   . Breast cancer Neg Hx     Past Surgical History:  Procedure Laterality Date  . CESAREAN SECTION     Social History    Occupational History  . Not on file  Tobacco Use  . Smoking status: Never Smoker  . Smokeless tobacco: Never Used  Substance and Sexual Activity  . Alcohol use: No    Frequency: Never  . Drug use: Not on file  . Sexual activity: Not on file

## 2018-07-14 ENCOUNTER — Other Ambulatory Visit (INDEPENDENT_AMBULATORY_CARE_PROVIDER_SITE_OTHER): Payer: Self-pay

## 2018-07-14 ENCOUNTER — Encounter: Payer: BLUE CROSS/BLUE SHIELD | Attending: Family Medicine | Admitting: *Deleted

## 2018-07-14 DIAGNOSIS — R102 Pelvic and perineal pain: Secondary | ICD-10-CM

## 2018-07-14 DIAGNOSIS — Z713 Dietary counseling and surveillance: Secondary | ICD-10-CM | POA: Diagnosis not present

## 2018-07-14 DIAGNOSIS — E119 Type 2 diabetes mellitus without complications: Secondary | ICD-10-CM | POA: Diagnosis not present

## 2018-07-14 NOTE — Progress Notes (Signed)
Diabetes Self-Management Education  Visit Type: First/Initial  Appt. Start Time: 1000 Appt. End Time: 1130  07/14/2018  [name], identified by name and date of birth, is a 57 y.o. female with a diagnosis of Diabetes: Type 2. Patient states she has 3 grown sons and one of them has had type 1 Diabetes since he was 58 years old. So she has had extensive diabetes education with him. She works 4 hour days with [employer] Sundays - Thursdays from around noon to 4 PM. She had a personal trainer for a year last year, lost 60 pounds and felt terrific. 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 She is not testing her BG anymore and has resumed regular sodas as she keeps hearing diet sodas are "bad".   ASSESSMENT  There were no vitals taken for this visit. There is no height or weight on file to calculate BMI.  Diabetes Self-Management Education - 07/14/18 1027      Visit Information   Visit Type  First/Initial      Initial Visit   Diabetes Type  Type 2    Are you currently following a meal plan?  No    Are you taking your medications as prescribed?  Yes    Date Diagnosed  about 2 years ago      Health Coping   How would you rate your overall health?  Fair      Psychosocial Assessment   Patient Belief/Attitude about Diabetes  Motivated to manage diabetes    Self-care barriers  None    Self-management support  Family    Other persons present  Patient    Patient Concerns  Nutrition/Meal planning;Glycemic Control;Weight Control    Special Needs  None    Preferred Learning Style  No preference indicated    Learning Readiness  Contemplating    How often do you need to have someone help you when you read instructions, pamphlets, or other written materials from your doctor or pharmacy?  1 - Never    What is the last grade level you completed in school?  college plus      Pre-Education Assessment   Patient understands the diabetes disease and  treatment process.  Demonstrates understanding / competency    Patient understands incorporating nutritional management into lifestyle.  Needs Review    Patient undertands incorporating physical activity into lifestyle.  Needs Review    Patient understands using medications safely.  Demonstrates understanding / competency    Patient understands monitoring blood glucose, interpreting and using results  Needs Review    Patient understands prevention, detection, and treatment of acute complications.  Demonstrates understanding / competency    Patient understands prevention, detection, and treatment of chronic complications.  Demonstrates understanding / competency    Patient understands how to develop strategies to address psychosocial issues.  Needs Review    Patient understands how to develop strategies to promote health/change behavior.  Needs Review      Complications   Last HgB A1C per patient/outside source  7.5 %    How often do you check your blood sugar?  Patient declines    Have you had a dilated eye exam in the past 12 months?  No    Have you had a dental exam in the past 12 months?  Yes    Are you checking your feet?  No      Dietary Intake   Breakfast  11 AM  on way to work: whatever is in Therapist, artreftigerator meat on toast sandwich OR     Snack (morning)  works 12-4 PM, may have pack of crackers at work    Kinder Morgan EnergySnack (afternoon)  occasionally small bag of chips OR cookies OR fresh fruit including grapes    Dinner  meat, starch and occasionally vegetables    Snack (evening)  not usually    Beverage(s)  coffee with 5 tsp sugar, regular 20 oz soda at work, water      Exercise   Exercise Type  ADL's   stands at work for 4 hours a day     Patient Education   Previous Diabetes Education  Yes (please comment)    Disease state   Factors that contribute to the development of diabetes    Nutrition management   Role of diet in the treatment of diabetes and the relationship between the three main  macronutrients and blood glucose level;Carbohydrate counting;Reviewed blood glucose goals for pre and post meals and how to evaluate the patients' food intake on their blood glucose level.    Physical activity and exercise   Role of exercise on diabetes management, blood pressure control and cardiac health.;Helped patient identify appropriate exercises in relation to his/her diabetes, diabetes complications and other health issue.    Monitoring  Identified appropriate SMBG and/or A1C goals.    Psychosocial adjustment  Role of stress on diabetes    Personal strategies to promote health  Helped patient develop diabetes management plan for (enter comment)      Individualized Goals (developed by patient)   Nutrition  Follow meal plan discussed    Physical Activity  Exercise 3-5 times per week    Medications  take my medication as prescribed    Monitoring   test blood glucose pre and post meals as discussed      Post-Education Assessment   Patient understands the diabetes disease and treatment process.  Demonstrates understanding / competency    Patient understands incorporating nutritional management into lifestyle.  Demonstrates understanding / competency    Patient undertands incorporating physical activity into lifestyle.  Demonstrates understanding / competency    Patient understands using medications safely.  Demonstrates understanding / competency    Patient understands monitoring blood glucose, interpreting and using results  Demonstrates understanding / competency    Patient understands prevention, detection, and treatment of acute complications.  Demonstrates understanding / competency    Patient understands prevention, detection, and treatment of chronic complications.  Demonstrates understanding / competency    Patient understands how to develop strategies to address psychosocial issues.  Demonstrates understanding / competency    Patient understands how to develop strategies to promote  health/change behavior.  Demonstrates understanding / competency      Outcomes   Expected Outcomes  Demonstrated interest in learning. Expect positive outcomes    Future DMSE  4-6 wks    Program Status  Completed       Individualized Plan for Diabetes Self-Management Training:   Learning Objective:  Patient will have a greater understanding of diabetes self-management. Patient education plan is to attend individual and/or group sessions per assessed needs and concerns.   Plan:   Patient Instructions  Plan:  Aim for 3 Carb Choices per meal (45 grams) +/- 1 either way  Aim for 0-2 Carbs per snack if hungry  Include lean protein in moderation with your meals and snacks Consider reading food labels for Total Carbohydrate of foods Consider  increasing your activity level  as tolerated (We have discussed some hiking opportunities locally) Consider checking BG at alternate times per day including after some meals and after increased activity  Continue taking medication as directed by MD  Expected Outcomes:  Demonstrated interest in learning. Expect positive outcomes  Education material provided: A1C conversion sheet, Meal plan card, Support group flyer and Carbohydrate counting sheet  Her son may be interested in the activities of the Type 1 Support Group?  If problems or questions, patient to contact team via:  Phone  Future DSME appointment: 4-6 wks

## 2018-07-14 NOTE — Patient Instructions (Signed)
Plan:  Aim for 3 Carb Choices per meal (45 grams) +/- 1 either way  Aim for 0-2 Carbs per snack if hungry  Include lean protein in moderation with your meals and snacks Consider reading food labels for Total Carbohydrate of foods Consider  increasing your activity level  as tolerated (We have discussed some hiking opportunities locally) Consider checking BG at alternate times per day including after some meals and after increased activity  Continue taking medication as directed by MD

## 2018-07-27 ENCOUNTER — Ambulatory Visit
Admission: RE | Admit: 2018-07-27 | Discharge: 2018-07-27 | Disposition: A | Discharge status: Home / Self Care | Payer: BLUE CROSS/BLUE SHIELD | Source: Ambulatory Visit | Attending: Orthopaedic Surgery | Admitting: Orthopaedic Surgery

## 2018-07-27 DIAGNOSIS — R102 Pelvic and perineal pain: Secondary | ICD-10-CM

## 2018-08-01 ENCOUNTER — Encounter (INDEPENDENT_AMBULATORY_CARE_PROVIDER_SITE_OTHER): Payer: Self-pay | Admitting: Orthopaedic Surgery

## 2018-08-01 ENCOUNTER — Other Ambulatory Visit (INDEPENDENT_AMBULATORY_CARE_PROVIDER_SITE_OTHER): Payer: Self-pay

## 2018-08-01 ENCOUNTER — Ambulatory Visit (INDEPENDENT_AMBULATORY_CARE_PROVIDER_SITE_OTHER): Payer: BLUE CROSS/BLUE SHIELD | Admitting: Orthopaedic Surgery

## 2018-08-01 DIAGNOSIS — R102 Pelvic and perineal pain: Secondary | ICD-10-CM

## 2018-08-01 MED ORDER — DICLOFENAC SODIUM 1 % TD GEL: 2.0000 g | Freq: Four times a day (QID) | TRANSDERMAL | 3 refills | Status: AC

## 2018-08-01 MED ORDER — IBUPROFEN 800 MG PO TABS: 800.0000 mg | ORAL_TABLET | Freq: Three times a day (TID) | ORAL | 0 refills | Status: DC | PRN

## 2018-08-01 NOTE — Progress Notes (Signed)
The patient is coming in to go over an MRI of her pelvis.  She is having pelvic pain in the front of her pubis area and in the SI joints with normal-appearing hips on my exam and x-rays.  She is someone who is very pleasant but is moderately obese.  She says her sister has the same thing.  She denies any change in bowel bladder function.  On exam her pain is on bilateral SI joints to palpation and stress.  She also has significant pain around the pubis.  Her hip exam is normal.  MRI is independently reviewed with her and I shared with her the report.  She does have cystic changes on either side of the pubis which can be indicative of instability.  She has significant sclerosis and bilateral SI joints.  Both hips appear normal.  From my standpoint is not a surgery that she needs.  Certainly weight loss can help.  I do feel that she would benefit from a guided physical therapy program for pelvic pain and we can send her to [facility] for this.  We will refill her 800 mg ibuprofen and try Voltaren gel as well.  We will see her back in about 6 weeks to see how she is done.  All question concerns were answered and addressed.

## 2018-08-11 ENCOUNTER — Ambulatory Visit: Payer: BLUE CROSS/BLUE SHIELD | Admitting: *Deleted

## 2018-08-18 ENCOUNTER — Encounter: Payer: BLUE CROSS/BLUE SHIELD | Attending: Family Medicine | Admitting: *Deleted

## 2018-08-18 DIAGNOSIS — Z713 Dietary counseling and surveillance: Secondary | ICD-10-CM | POA: Insufficient documentation

## 2018-08-18 DIAGNOSIS — E119 Type 2 diabetes mellitus without complications: Secondary | ICD-10-CM | POA: Diagnosis not present

## 2018-08-18 NOTE — Progress Notes (Signed)
Diabetes Self-Management Education  Visit Type:  Follow-up  Appt. Start Time: 0800 Appt. End Time: 0830  08/18/2018  [name], identified by name and date of birth, is a 57 y.o. female with a diagnosis of Diabetes: Type 2.  Patient states she is pleased with her improved health status with improved blood pressure, cholesterol levels and A1c is down to 7.1%. She has increased her walking to 3-5 days a week for 30 minutes walking around [facility].  ASSESSMENT  There were no vitals taken for this visit. There is no height or weight on file to calculate BMI.   Diabetes Self-Management Education - 08/18/18 1254      Complications   Last HgB A1C per patient/outside source  --   7.1     Exercise   Exercise Type  Light (walking / raking leaves)    How many days per week to you exercise?  --   4   How many minutes per day do you exercise?  --   30     Patient Self-Evaluation of Goals - Patient rates self as meeting previously set goals (% of time)   Nutrition  >75%    Physical Activity  >75%    Medications  >75%    Monitoring  >75%    Problem Solving  >75%    Reducing Risk  >75%      Outcomes   Program Status  Completed      Subsequent Visit   Since your last visit have you had your blood pressure checked?  Yes    Is your most recent blood pressure lower, unchanged, or higher since your last visit?  Lower    Since your last visit have you experienced any weight changes?  Loss    Weight Loss (lbs)  --   4 POUNDS   Since your last visit, are you checking your blood glucose at least once a day?  Yes       Learning Objective:  Patient will have a greater understanding of diabetes self-management. Patient education plan is to attend individual and/or group sessions per assessed needs and concerns.   Plan:   Patient Instructions  Plan:  Aim for 3 Carb Choices per meal (45 grams) +/- 1 either way  Aim for 0-2 Carbs per snack if hungry  Include  lean protein in moderation with your meals and snacks Consider reading food labels for Total Carbohydrate of foods Continue with your activity level  as tolerated (We have discussed some hiking opportunities locally) Continue checking BG at alternate times per day including after some meals and after increased activity  Continue taking medication as directed by MD  Expected Outcomes:  Demonstrated interest in learning. Expect positive outcomes  Education material provided: No new handouts today  If problems or questions, patient to contact team via:  Phone  Future DSME appointment: - 3-4 months

## 2018-08-18 NOTE — Patient Instructions (Addendum)
Plan:  Aim for 3 Carb Choices per meal (45 grams) +/- 1 either way  Aim for 0-2 Carbs per snack if hungry  Include lean protein in moderation with your meals and snacks Consider reading food labels for Total Carbohydrate of foods Continue with your activity level  as tolerated (We have discussed some hiking opportunities locally) Continue checking BG at alternate times per day including after some meals and after increased activity  Continue taking medication as directed by MD

## 2018-08-28 ENCOUNTER — Other Ambulatory Visit (INDEPENDENT_AMBULATORY_CARE_PROVIDER_SITE_OTHER): Payer: Self-pay | Admitting: Orthopaedic Surgery

## 2018-09-12 ENCOUNTER — Ambulatory Visit (INDEPENDENT_AMBULATORY_CARE_PROVIDER_SITE_OTHER): Payer: BLUE CROSS/BLUE SHIELD | Admitting: Orthopaedic Surgery

## 2018-09-26 ENCOUNTER — Ambulatory Visit (HOSPITAL_COMMUNITY)
Admission: EM | Admit: 2018-09-26 | Discharge: 2018-09-26 | Discharge status: Against Medical Advice | Payer: BLUE CROSS/BLUE SHIELD | Attending: Family Medicine | Admitting: Family Medicine

## 2018-09-26 NOTE — ED Notes (Signed)
Patient called with no response, registration states patient left.

## 2018-11-10 ENCOUNTER — Ambulatory Visit: Payer: BLUE CROSS/BLUE SHIELD | Admitting: *Deleted

## 2019-01-01 ENCOUNTER — Ambulatory Visit (HOSPITAL_COMMUNITY)
Admission: EM | Admit: 2019-01-01 | Discharge: 2019-01-01 | Disposition: A | Discharge status: Home / Self Care | Payer: BLUE CROSS/BLUE SHIELD | Attending: Family Medicine | Admitting: Family Medicine

## 2019-01-01 ENCOUNTER — Other Ambulatory Visit: Payer: Self-pay

## 2019-01-01 ENCOUNTER — Encounter (HOSPITAL_COMMUNITY): Payer: Self-pay

## 2019-01-01 DIAGNOSIS — J069 Acute upper respiratory infection, unspecified: Secondary | ICD-10-CM

## 2019-01-01 NOTE — ED Provider Notes (Signed)
[facility]    CSN: [id number] Arrival date & time: 01/01/19  1914     History   Chief Complaint Chief Complaint  Patient presents with  . Cough    HPI Jill Rich is a 58 y.o. female.   She is presenting with a 2-day history of cough and malaise.  Denies any fevers.  Feels like she is improved this afternoon.  Has not had any fevers to speak of.  Denies any emesis or diarrhea.  Works at [employer].  Denies any recent travel or rash.  HPI  Past Medical History:  Diagnosis Date  . Diabetes mellitus without complication (HCC)   . Hypertension     There are no active problems to display for this patient.   Past Surgical History:  Procedure Laterality Date  . CESAREAN SECTION      OB History   No obstetric history on file.      Home Medications    Prior to Admission medications   Medication Sig Start Date End Date Taking? Authorizing Provider  albuterol (PROVENTIL HFA;VENTOLIN HFA) 108 (90 Base) MCG/ACT inhaler Inhale 1-2 puffs into the lungs every 6 (six) hours as needed for wheezing or shortness of breath.    [provider]  amLODipine (NORVASC) 10 MG tablet Take 10 mg by mouth daily.    [provider]  aspirin EC 81 MG tablet Take 81 mg by mouth daily.    [provider]  carvedilol (COREG) 25 MG tablet Take 25 mg by mouth 2 (two) times daily with a meal.    [provider]  diclofenac sodium (VOLTAREN) 1 % GEL Apply 2 g topically 4 (four) times daily. 08/01/18   Jill Hitch, MD  ibuprofen (ADVIL,MOTRIN) 800 MG tablet TAKE 1 TABLET BY MOUTH EVERY 8 HOURS AS NEEDED 08/28/18   Jill Hitch, MD  metFORMIN (GLUCOPHAGE) 500 MG tablet Take 1,000 mg by mouth 2 (two) times daily with a meal.    [provider]  Multiple Vitamin (MULTIVITAMIN WITH MINERALS) TABS tablet Take 1 tablet by mouth daily.    [provider]  naproxen (NAPROSYN) 375 MG tablet Take 1 tablet (375 mg total) by  mouth 2 (two) times daily. Patient not taking: Reported on 07/02/2018 10/17/17   Jill Rasmussen, NP  predniSONE (STERAPRED UNI-PAK 21 TAB) 10 MG (21) TBPK tablet Take by mouth daily. Take as directed. Patient not taking: Reported on 07/02/2018 02/21/18   Jill Layman, MD  tiZANidine (ZANAFLEX) 4 MG tablet Take 1 tablet (4 mg total) by mouth every 8 (eight) hours as needed for muscle spasms. 07/13/18   Jill Hitch, MD  traMADol (ULTRAM) 50 MG tablet Take 1 tablet (50 mg total) by mouth every 6 (six) hours as needed for severe pain. 07/02/18   Lawyer, Jill Deer, PA-C  triamterene-hydrochlorothiazide (MAXZIDE-25) 37.5-25 MG tablet Take 1 tablet by mouth daily.    [provider]    Family History Family History  Problem Relation Age of Onset  . Stroke Mother   . Heart failure Mother   . Diabetes Mother   . Cancer Father   . Diabetes Child   . Breast cancer Neg Hx     Social History Social History   Tobacco Use  . Smoking status: Never Smoker  . Smokeless tobacco: Never Used  Substance Use Topics  . Alcohol use: No    Frequency: Never  . Drug use: Not on file     Allergies   Patient  has no known allergies.   Review of Systems Review of Systems  Constitutional: Negative for fever.  HENT: Negative for congestion.   Respiratory: Positive for cough.   Cardiovascular: Negative for chest pain.  Gastrointestinal: Negative for abdominal distention.  Musculoskeletal: Negative for back pain.  Skin: Negative for color change.  Hematological: Negative for adenopathy.  Psychiatric/Behavioral: Negative for agitation.     Physical Exam Triage Vital Signs ED Triage Vitals  Enc Vitals Group     BP 01/01/19 2015 (!) 167/93     Pulse Rate 01/01/19 2015 90     Resp 01/01/19 2015 17     Temp 01/01/19 2015 98 F (36.7 C)     Temp Source 01/01/19 2015 Oral     SpO2 01/01/19 2015 99 %     Weight 01/01/19 2023 253 lb (114.8 kg)     Height --      Head Circumference --       Peak Flow --      Pain Score --      Pain Loc --      Pain Edu? --      Excl. in GC? --    No data found.  Updated Vital Signs BP (!) 167/93 (BP Location: Left Arm)   Pulse 90   Temp 98 F (36.7 C) (Oral)   Resp 17   Wt 114.8 kg   SpO2 99%   BMI 42.10 kg/m   Visual Acuity Right Eye Distance:   Left Eye Distance:   Bilateral Distance:    Right Eye Near:   Left Eye Near:    Bilateral Near:     Physical Exam Gen: NAD, alert, cooperative with exam,  ENT: normal lips, normal nasal mucosa, tympanic membranes clear and intact bilaterally, normal oropharynx,  Eye: normal EOM, normal conjunctiva and lids CV:  no edema, +2 pedal pulses, regular rate and rhythm, S1-S2   Resp: no accessory muscle use, non-labored, clear to auscultation bilaterally, no crackles or wheezes  Skin: no rashes, no areas of induration  Neuro: normal tone, normal sensation to touch Psych:  normal insight, alert and oriented MSK: Normal gait, normal strength    UC Treatments / Results  Labs (all labs ordered are listed, but only abnormal results are displayed) Labs Reviewed - No data to display  EKG None  Radiology No results found.  Procedures Procedures (including critical care time)  Medications Ordered in UC Medications - No data to display  Initial Impression / Assessment and Plan / UC Course  I have reviewed the triage vital signs and the nursing notes.  Pertinent labs & imaging results that were available during my care of the patient were reviewed by me and considered in my medical decision making (see chart for details).     [name] is a 58 year old female is presenting with symptoms suggestive of a URI.  Likely viral in nature.  She feels improvement this afternoon.  No wheezing or crackles on exam.  Counseled on supportive care.  Provided work note for today and tomorrow.  Given indications to follow-up and return.  Final Clinical Impressions(s) / UC Diagnoses   Final  diagnoses:  Acute upper respiratory infection     Discharge Instructions     Please try things such as zyrtec-D or allegra-D which is an antihistamine and decongestant.  Please try honey, vick's vapor rub, lozenges and humidifer for cough and sore throat  Please follow up if your symptoms fail to improve.  ED Prescriptions    None     Controlled Substance Prescriptions [place] Controlled Substance Registry consulted? Not Applicable   Myra Rude, MD 01/01/19 2125

## 2019-01-01 NOTE — Discharge Instructions (Addendum)
Please try things such as zyrtec-D or allegra-D which is an antihistamine and decongestant.  Please try honey, vick's vapor rub, lozenges and humidifer for cough and sore throat  Please follow up if your symptoms fail to improve.

## 2019-01-01 NOTE — ED Triage Notes (Signed)
Pt cc deep cough , sore throat and vomiting .. pt states she has been feel off balanced this started this morning. Chest discomfort off and on months.

## 2019-01-05 ENCOUNTER — Other Ambulatory Visit (INDEPENDENT_AMBULATORY_CARE_PROVIDER_SITE_OTHER): Payer: Self-pay | Admitting: Orthopaedic Surgery

## 2019-02-04 ENCOUNTER — Other Ambulatory Visit (INDEPENDENT_AMBULATORY_CARE_PROVIDER_SITE_OTHER): Payer: Self-pay | Admitting: Orthopaedic Surgery

## 2019-05-23 ENCOUNTER — Other Ambulatory Visit: Payer: Self-pay | Admitting: Family Medicine

## 2019-05-23 DIAGNOSIS — Z1231 Encounter for screening mammogram for malignant neoplasm of breast: Secondary | ICD-10-CM

## 2019-07-06 ENCOUNTER — Ambulatory Visit
Admission: RE | Admit: 2019-07-06 | Discharge: 2019-07-06 | Disposition: A | Discharge status: Home / Self Care | Payer: BC Managed Care – PPO | Source: Ambulatory Visit | Attending: Family Medicine | Admitting: Family Medicine

## 2019-07-06 ENCOUNTER — Other Ambulatory Visit: Payer: Self-pay

## 2019-07-06 DIAGNOSIS — Z1231 Encounter for screening mammogram for malignant neoplasm of breast: Secondary | ICD-10-CM

## 2019-10-01 NOTE — Progress Notes (Signed)
  Self Swab Type: Anterior Nasal

## 2020-06-27 ENCOUNTER — Other Ambulatory Visit: Payer: Self-pay | Admitting: Family Medicine

## 2020-06-27 DIAGNOSIS — Z1231 Encounter for screening mammogram for malignant neoplasm of breast: Secondary | ICD-10-CM

## 2020-07-15 ENCOUNTER — Ambulatory Visit
Admission: RE | Admit: 2020-07-15 | Discharge: 2020-07-15 | Disposition: A | Discharge status: Home / Self Care | Payer: BC Managed Care – PPO | Source: Ambulatory Visit | Attending: Family Medicine | Admitting: Family Medicine

## 2020-07-15 ENCOUNTER — Other Ambulatory Visit: Payer: Self-pay

## 2020-07-15 DIAGNOSIS — Z1231 Encounter for screening mammogram for malignant neoplasm of breast: Secondary | ICD-10-CM

## 2021-01-06 ENCOUNTER — Other Ambulatory Visit: Payer: Self-pay | Admitting: Family Medicine

## 2021-01-06 DIAGNOSIS — N950 Postmenopausal bleeding: Secondary | ICD-10-CM

## 2021-01-15 ENCOUNTER — Other Ambulatory Visit: Payer: BC Managed Care – PPO

## 2021-06-22 ENCOUNTER — Other Ambulatory Visit: Payer: Self-pay | Admitting: Nephrology

## 2021-06-22 DIAGNOSIS — N182 Chronic kidney disease, stage 2 (mild): Secondary | ICD-10-CM

## 2021-07-03 ENCOUNTER — Ambulatory Visit
Admission: RE | Admit: 2021-07-03 | Discharge: 2021-07-03 | Disposition: A | Discharge status: Home / Self Care | Payer: BC Managed Care – PPO | Source: Ambulatory Visit | Attending: Nephrology | Admitting: Nephrology

## 2021-07-03 ENCOUNTER — Other Ambulatory Visit: Payer: Self-pay

## 2021-07-03 DIAGNOSIS — N182 Chronic kidney disease, stage 2 (mild): Secondary | ICD-10-CM

## 2021-08-18 ENCOUNTER — Ambulatory Visit (INDEPENDENT_AMBULATORY_CARE_PROVIDER_SITE_OTHER): Payer: BC Managed Care – PPO | Admitting: Bariatrics

## 2021-08-19 ENCOUNTER — Ambulatory Visit
Admission: RE | Admit: 2021-08-19 | Discharge: 2021-08-19 | Disposition: A | Discharge status: Home / Self Care | Payer: BC Managed Care – PPO | Source: Ambulatory Visit | Attending: Family Medicine | Admitting: Family Medicine

## 2021-08-19 ENCOUNTER — Other Ambulatory Visit: Payer: Self-pay

## 2021-08-19 ENCOUNTER — Other Ambulatory Visit: Payer: Self-pay | Admitting: Family Medicine

## 2021-08-19 DIAGNOSIS — Z1231 Encounter for screening mammogram for malignant neoplasm of breast: Secondary | ICD-10-CM

## 2021-09-01 ENCOUNTER — Ambulatory Visit (INDEPENDENT_AMBULATORY_CARE_PROVIDER_SITE_OTHER): Payer: BC Managed Care – PPO | Admitting: Bariatrics

## 2021-11-10 DIAGNOSIS — E1169 Type 2 diabetes mellitus with other specified complication: Secondary | ICD-10-CM | POA: Diagnosis not present

## 2022-02-12 DIAGNOSIS — I10 Essential (primary) hypertension: Secondary | ICD-10-CM | POA: Diagnosis not present

## 2022-02-12 DIAGNOSIS — E1169 Type 2 diabetes mellitus with other specified complication: Secondary | ICD-10-CM | POA: Diagnosis not present

## 2022-02-12 DIAGNOSIS — K219 Gastro-esophageal reflux disease without esophagitis: Secondary | ICD-10-CM | POA: Diagnosis not present

## 2022-02-12 DIAGNOSIS — E7800 Pure hypercholesterolemia, unspecified: Secondary | ICD-10-CM | POA: Diagnosis not present

## 2022-03-11 ENCOUNTER — Other Ambulatory Visit (HOSPITAL_COMMUNITY)
Admission: RE | Admit: 2022-03-11 | Discharge: 2022-03-11 | Disposition: A | Discharge status: Home / Self Care | Payer: BC Managed Care – PPO | Source: Ambulatory Visit | Attending: Nurse Practitioner | Admitting: Nurse Practitioner

## 2022-03-11 ENCOUNTER — Other Ambulatory Visit: Payer: Self-pay | Admitting: Nurse Practitioner

## 2022-03-11 DIAGNOSIS — Z01419 Encounter for gynecological examination (general) (routine) without abnormal findings: Secondary | ICD-10-CM | POA: Diagnosis not present

## 2022-03-11 DIAGNOSIS — Z124 Encounter for screening for malignant neoplasm of cervix: Secondary | ICD-10-CM | POA: Diagnosis not present

## 2022-03-16 LAB — CYTOLOGY - PAP
Comment: NEGATIVE
Diagnosis: NEGATIVE
High risk HPV: NEGATIVE

## 2022-05-17 DIAGNOSIS — N182 Chronic kidney disease, stage 2 (mild): Secondary | ICD-10-CM | POA: Diagnosis not present

## 2022-06-02 DIAGNOSIS — I129 Hypertensive chronic kidney disease with stage 1 through stage 4 chronic kidney disease, or unspecified chronic kidney disease: Secondary | ICD-10-CM | POA: Diagnosis not present

## 2022-06-02 DIAGNOSIS — R809 Proteinuria, unspecified: Secondary | ICD-10-CM | POA: Diagnosis not present

## 2022-06-02 DIAGNOSIS — N182 Chronic kidney disease, stage 2 (mild): Secondary | ICD-10-CM | POA: Diagnosis not present

## 2022-06-24 DIAGNOSIS — E119 Type 2 diabetes mellitus without complications: Secondary | ICD-10-CM | POA: Diagnosis not present

## 2022-06-24 DIAGNOSIS — H2513 Age-related nuclear cataract, bilateral: Secondary | ICD-10-CM | POA: Diagnosis not present

## 2022-06-24 DIAGNOSIS — H5211 Myopia, right eye: Secondary | ICD-10-CM | POA: Diagnosis not present

## 2022-06-24 DIAGNOSIS — H04123 Dry eye syndrome of bilateral lacrimal glands: Secondary | ICD-10-CM | POA: Diagnosis not present

## 2022-06-24 DIAGNOSIS — H43393 Other vitreous opacities, bilateral: Secondary | ICD-10-CM | POA: Diagnosis not present

## 2022-07-13 ENCOUNTER — Other Ambulatory Visit: Payer: Self-pay | Admitting: Family Medicine

## 2022-07-13 DIAGNOSIS — Z1231 Encounter for screening mammogram for malignant neoplasm of breast: Secondary | ICD-10-CM

## 2022-07-14 DIAGNOSIS — Z23 Encounter for immunization: Secondary | ICD-10-CM | POA: Diagnosis not present

## 2022-07-14 NOTE — Progress Notes (Signed)
  Subjective:  Patient ID: Terese Henry-Rengel is a 61 y.o. female here for a Flu Vaccine visit.  Reason for today's vaccine: routine vaccine Are you sick today with a moderate to severe illness?: No Temperature:: 98.1 F (36.7 C) Have you ever had a serious reaction to any vaccine in the past?: No  Have you ever fainted, nearly fainted or been concerned about fainting after receiving an injection/vaccine?: No  Do you have any concerns receiving a vaccine in either arm (history of shoulder injury, mastectomy or other surgery?): No   Any patient-supplied  information was reviewed and discussed with the patient. : Oneil as reviewed. Has the VIS been reviewed?: Yes     Lifestyle: Annel reports that she has never smoked. She does not have any smokeless tobacco history on file.   Objective:   Assessment/Plan:  Vaccine administered in accordance with [facility] guidelines.   Patient advised to contact [facility] if adverse event occurs.

## 2022-07-22 DIAGNOSIS — Z01818 Encounter for other preprocedural examination: Secondary | ICD-10-CM | POA: Diagnosis not present

## 2022-07-22 DIAGNOSIS — J4520 Mild intermittent asthma, uncomplicated: Secondary | ICD-10-CM | POA: Diagnosis not present

## 2022-07-22 DIAGNOSIS — I10 Essential (primary) hypertension: Secondary | ICD-10-CM | POA: Diagnosis not present

## 2022-07-22 DIAGNOSIS — E119 Type 2 diabetes mellitus without complications: Secondary | ICD-10-CM | POA: Diagnosis not present

## 2022-08-13 DIAGNOSIS — E7800 Pure hypercholesterolemia, unspecified: Secondary | ICD-10-CM | POA: Diagnosis not present

## 2022-08-13 DIAGNOSIS — E1169 Type 2 diabetes mellitus with other specified complication: Secondary | ICD-10-CM | POA: Diagnosis not present

## 2022-08-13 DIAGNOSIS — I10 Essential (primary) hypertension: Secondary | ICD-10-CM | POA: Diagnosis not present

## 2022-08-13 DIAGNOSIS — Z0000 Encounter for general adult medical examination without abnormal findings: Secondary | ICD-10-CM | POA: Diagnosis not present

## 2022-08-13 DIAGNOSIS — K219 Gastro-esophageal reflux disease without esophagitis: Secondary | ICD-10-CM | POA: Diagnosis not present

## 2022-08-13 DIAGNOSIS — Z79899 Other long term (current) drug therapy: Secondary | ICD-10-CM | POA: Diagnosis not present

## 2022-08-20 ENCOUNTER — Ambulatory Visit
Admission: RE | Admit: 2022-08-20 | Discharge: 2022-08-20 | Disposition: A | Discharge status: Home / Self Care | Payer: BC Managed Care – PPO | Source: Ambulatory Visit | Attending: Family Medicine | Admitting: Family Medicine

## 2022-08-20 DIAGNOSIS — Z1231 Encounter for screening mammogram for malignant neoplasm of breast: Secondary | ICD-10-CM | POA: Diagnosis not present

## 2023-01-11 DIAGNOSIS — Z713 Dietary counseling and surveillance: Secondary | ICD-10-CM | POA: Diagnosis not present

## 2023-01-19 DIAGNOSIS — Z713 Dietary counseling and surveillance: Secondary | ICD-10-CM | POA: Diagnosis not present

## 2023-01-26 DIAGNOSIS — Z713 Dietary counseling and surveillance: Secondary | ICD-10-CM | POA: Diagnosis not present

## 2023-02-03 IMAGING — MG MM DIGITAL SCREENING BILAT W/ TOMO AND CAD
8 of 14 series · 8 of 40 positions shown · non-contrast
Comparison: Previous exam(s).

CLINICAL DATA: Screening.

EXAM:
DIGITAL SCREENING BILATERAL MAMMOGRAM WITH TOMOSYNTHESIS AND CAD
TECHNIQUE: Bilateral screening digital craniocaudal and mediolateral oblique
mammograms were obtained. Bilateral screening digital breast
tomosynthesis was performed. The images were evaluated with
computer-aided detection.

[R MLO synth-2D (1 of 2)]
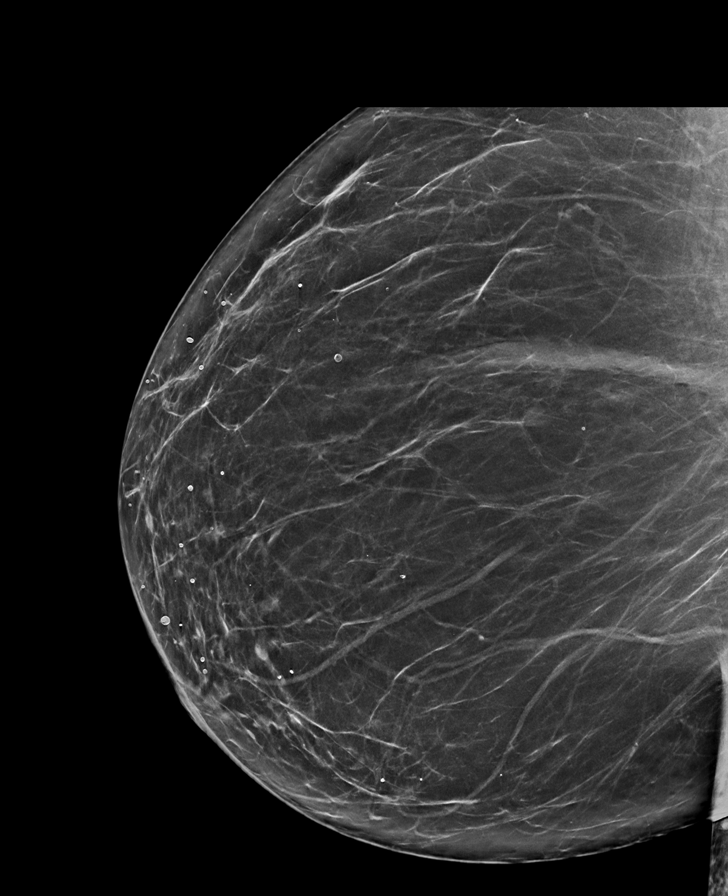

[L CC synth-2D]
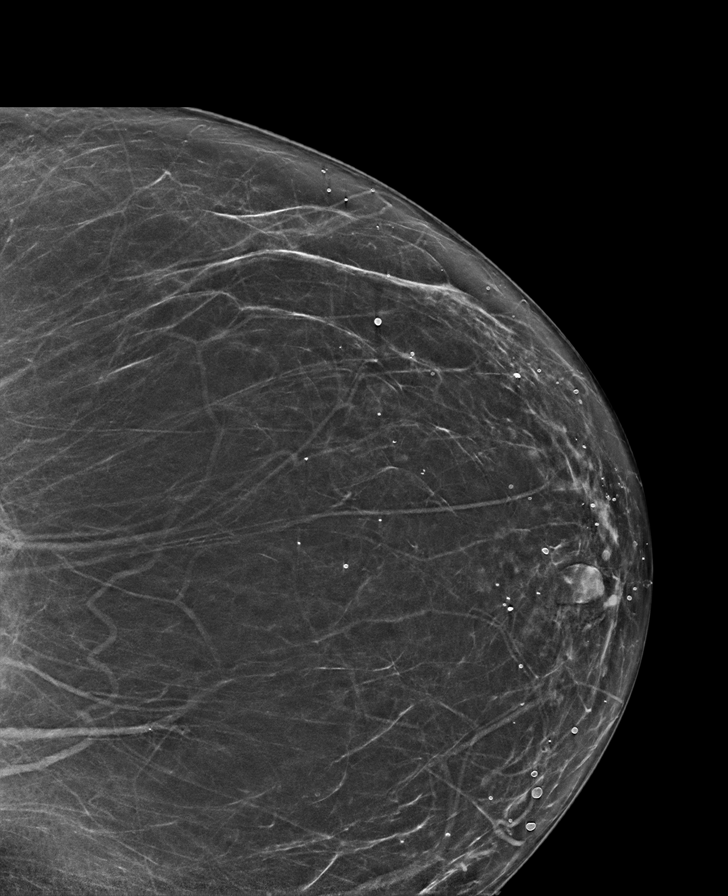

[R MLO synth-2D (2 of 2)]
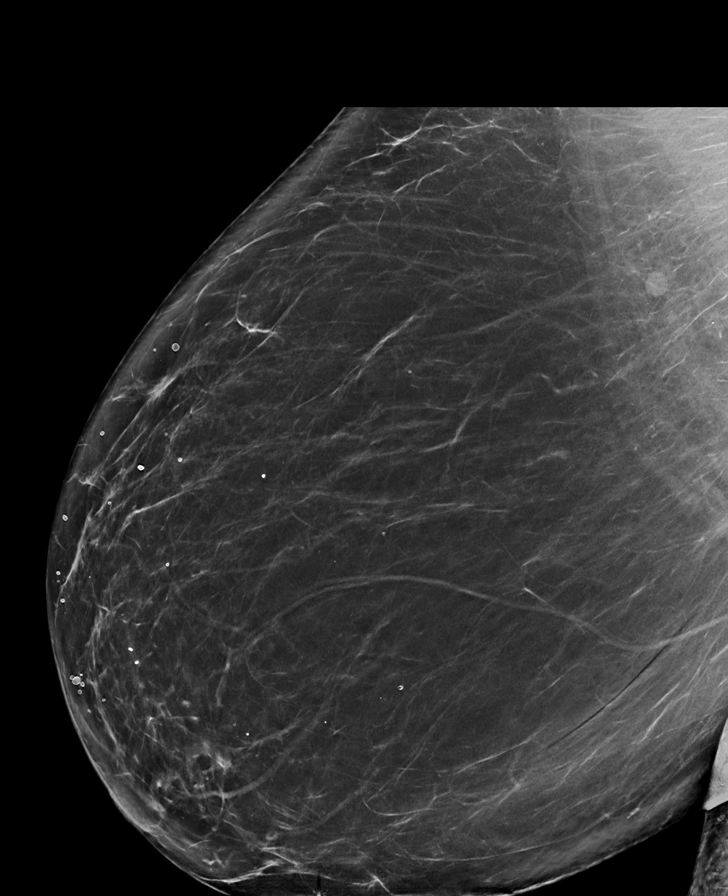

[R CV synth-2D]
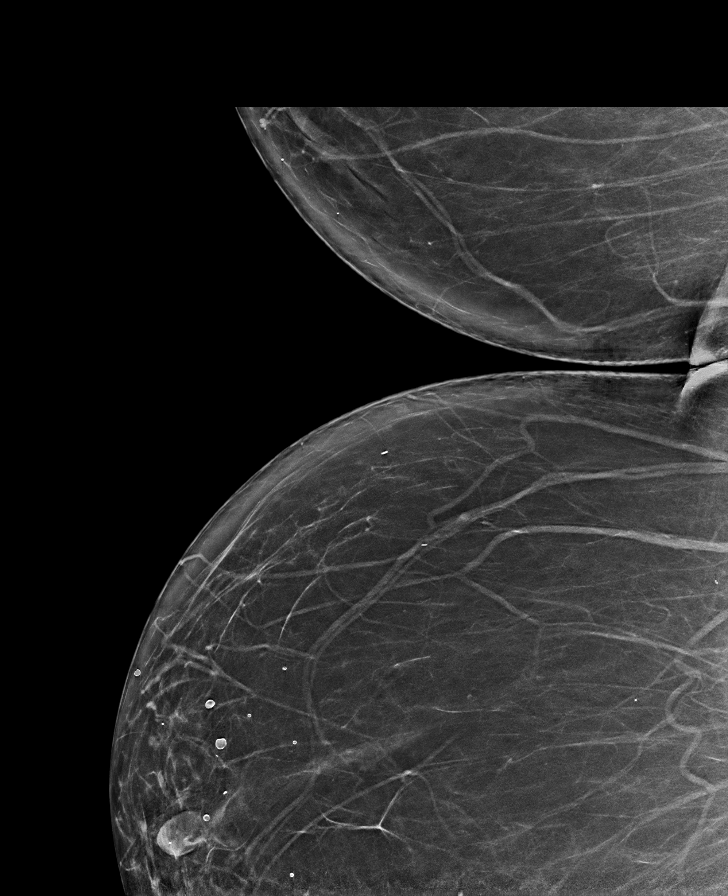

[L MLO synth-2D (1 of 2)]
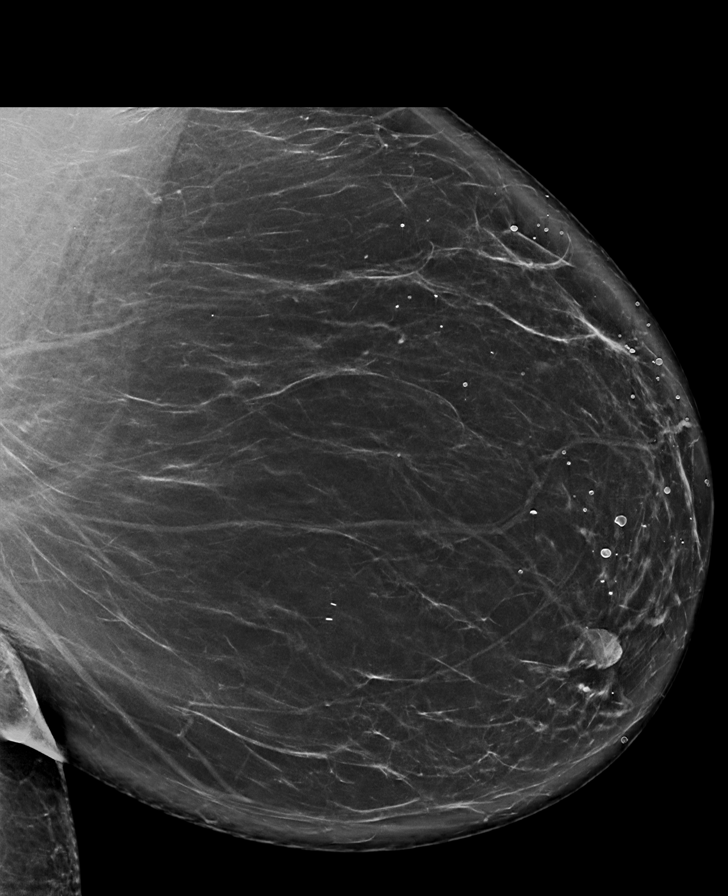

[R CC synth-2D]
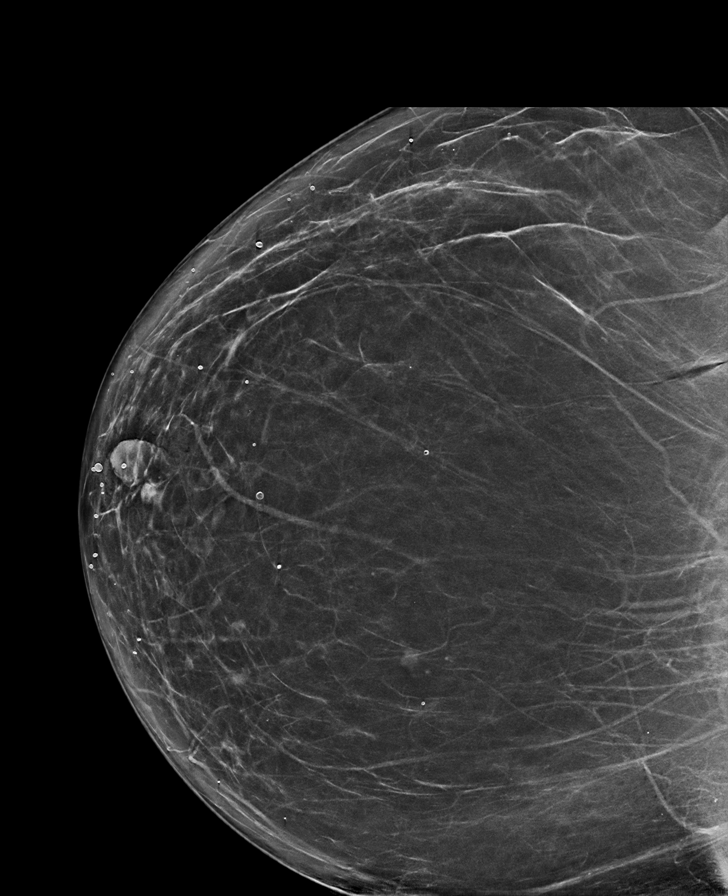

[L MLO synth-2D (2 of 2)]
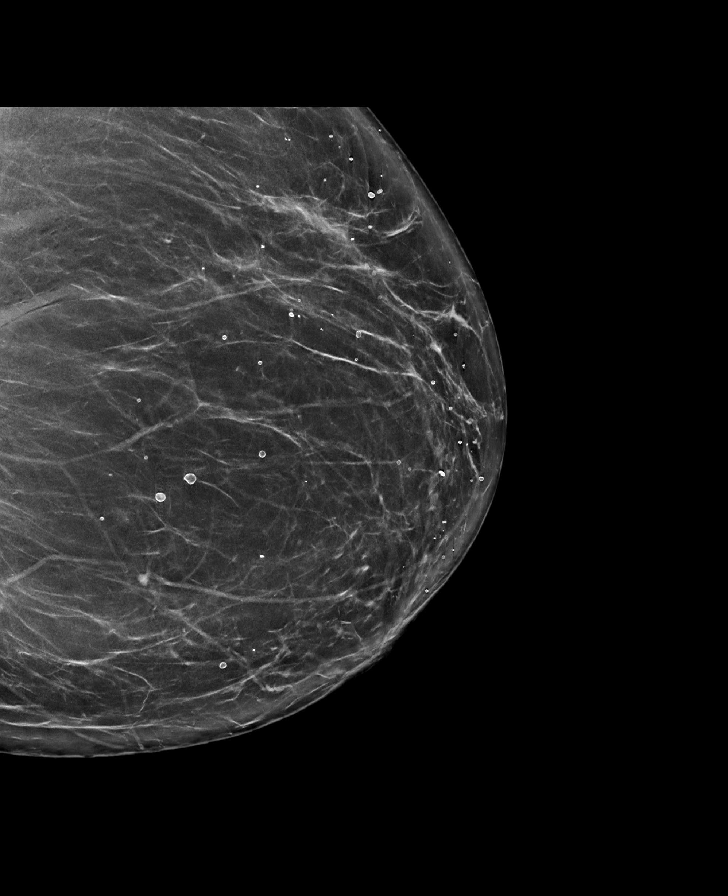

[R CV tomo · tomo slice 38/75.0]
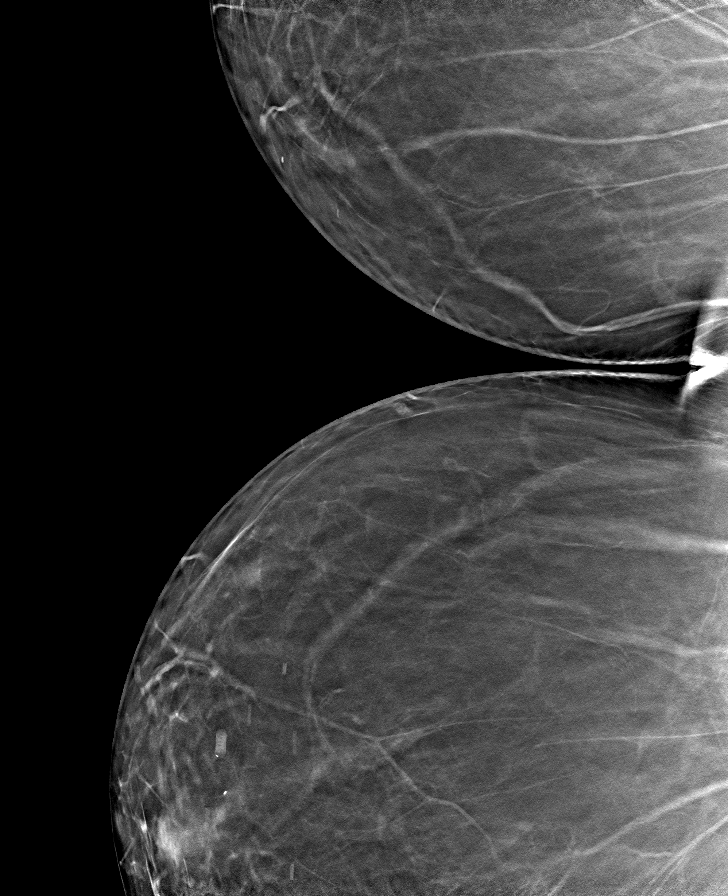

[8 of 40 positions shown; findings below may reference images not displayed]

ACR Breast Density Category b: There are scattered areas of
fibroglandular density.
FINDINGS: There are no findings suspicious for malignancy.
IMPRESSION: No mammographic evidence of malignancy. A result letter of this
screening mammogram will be mailed directly to the patient.

RECOMMENDATION:
Screening mammogram in one year. (Code:51-O-LD2)

BI-RADS CATEGORY  1: Negative.

## 2023-02-09 DIAGNOSIS — Z713 Dietary counseling and surveillance: Secondary | ICD-10-CM | POA: Diagnosis not present

## 2023-02-15 DIAGNOSIS — Z713 Dietary counseling and surveillance: Secondary | ICD-10-CM | POA: Diagnosis not present

## 2023-02-16 DIAGNOSIS — E1169 Type 2 diabetes mellitus with other specified complication: Secondary | ICD-10-CM | POA: Diagnosis not present

## 2023-02-16 DIAGNOSIS — E7800 Pure hypercholesterolemia, unspecified: Secondary | ICD-10-CM | POA: Diagnosis not present

## 2023-02-16 DIAGNOSIS — I10 Essential (primary) hypertension: Secondary | ICD-10-CM | POA: Diagnosis not present

## 2023-02-17 DIAGNOSIS — E7800 Pure hypercholesterolemia, unspecified: Secondary | ICD-10-CM | POA: Diagnosis not present

## 2023-02-17 DIAGNOSIS — E1169 Type 2 diabetes mellitus with other specified complication: Secondary | ICD-10-CM | POA: Diagnosis not present

## 2023-02-18 DIAGNOSIS — Z713 Dietary counseling and surveillance: Secondary | ICD-10-CM | POA: Diagnosis not present

## 2023-02-23 DIAGNOSIS — Z713 Dietary counseling and surveillance: Secondary | ICD-10-CM | POA: Diagnosis not present

## 2023-03-02 DIAGNOSIS — Z713 Dietary counseling and surveillance: Secondary | ICD-10-CM | POA: Diagnosis not present

## 2023-03-22 DIAGNOSIS — Z01419 Encounter for gynecological examination (general) (routine) without abnormal findings: Secondary | ICD-10-CM | POA: Diagnosis not present

## 2023-03-22 DIAGNOSIS — N898 Other specified noninflammatory disorders of vagina: Secondary | ICD-10-CM | POA: Diagnosis not present

## 2023-06-16 DIAGNOSIS — S93401A Sprain of unspecified ligament of right ankle, initial encounter: Secondary | ICD-10-CM | POA: Diagnosis not present

## 2023-06-16 DIAGNOSIS — S83411A Sprain of medial collateral ligament of right knee, initial encounter: Secondary | ICD-10-CM | POA: Diagnosis not present

## 2023-06-16 DIAGNOSIS — W1840XA Slipping, tripping and stumbling without falling, unspecified, initial encounter: Secondary | ICD-10-CM | POA: Diagnosis not present

## 2023-06-16 NOTE — Progress Notes (Signed)
 Chief Complaint  Patient presents with  . right leg pain    Patient presents with right leg/knee pain that started Saturday after slipping in the rain. Mild swelling, no bruising. Patient reports tingling when lifting leg up and down. Patient is able to bear weight on leg. Patient reports pain when standing up. Patient has tried Tylenol with minimal relief.    has a past medical history of Diabetes mellitus (CMS/HCC) and Hypertension.  HPI Jill Rich is a 62 year old who slipped on her porch 5 days ago causing her to fall twisting her right knee and ankle.  She has been weightbearing but symptoms continue despite rest and ice.  She says the knee feels hot and she has had difficulty fully extending or fully flexing the knee.  She has been working on this at home with range of motion exercises.  No prior surgery to the knee or ankle.  Patient is currently on no medication and has no medical condition that may reduce immunity or increase the risk for serious infection.  Social History   Tobacco Use  Smoking Status Never  Smokeless Tobacco Never    BP (!) 143/95 (BP Location: Left arm, Patient Position: Sitting)   Pulse 95   Temp 98.5 F (36.9 C) (Tympanic)   Resp 20   Ht 1.651 m (5' 5)   Wt 127 kg (280 lb)   SpO2 100%   BMI 46.59 kg/m   Review of Systems  Review of systems is otherwise negative except as noted in the HPI and Assessment/MDM  Physical Exam During this patient encounter, the patient was wearing a mask.    Constitutional:      General: Patient is not in acute respiratory distress.    Appearance: Normal appearance.  Right ankle exam Mild swelling over the lateral malleolus.  This is not tender.  She has some tenderness at the head of the talus.  No metatarsal tenderness. No tenderness in the achilles, calcaneus, or arch.  No calf tenderness.  Right knee Pain with flexion and extension.  Tenderness over medial jointline.  Unable to perform MCMurray's due to  patient pain and decreased ROM.  Knee is stable with lachmann.  She has pain over the medial jointline with valgus stress.  No significant pain in the lateral jointline.   No orders to display      1. Sprain of medial collateral ligament of right knee, initial encounter  XR Knee 3 Views Right    2. Sprain of right ankle, unspecified ligament, initial encounter  XR Ankle Minimum 3 Views Right     This is a 62 year old who suffered a sprain to the right knee and has symptoms concerning for meniscus tear.  She is weightbearing and improvement in her range of motion over the past few days but still is limited with flexion and extension. Today shows I recommend she continue ice, range of motion exercises, and follow-up with orthopedics.  The right ankle/foot injury is minor and improving.  X-ray showed no fracture.    We discussed risks and side effects of medications, and also discussed red flags which would warrant immediate follow-up.

## 2023-06-22 DIAGNOSIS — N182 Chronic kidney disease, stage 2 (mild): Secondary | ICD-10-CM | POA: Diagnosis not present

## 2023-06-22 DIAGNOSIS — R809 Proteinuria, unspecified: Secondary | ICD-10-CM | POA: Diagnosis not present

## 2023-06-22 DIAGNOSIS — I129 Hypertensive chronic kidney disease with stage 1 through stage 4 chronic kidney disease, or unspecified chronic kidney disease: Secondary | ICD-10-CM | POA: Diagnosis not present

## 2023-06-22 DIAGNOSIS — E1122 Type 2 diabetes mellitus with diabetic chronic kidney disease: Secondary | ICD-10-CM | POA: Diagnosis not present

## 2023-06-23 DIAGNOSIS — N182 Chronic kidney disease, stage 2 (mild): Secondary | ICD-10-CM | POA: Diagnosis not present

## 2023-06-29 DIAGNOSIS — E119 Type 2 diabetes mellitus without complications: Secondary | ICD-10-CM | POA: Diagnosis not present

## 2023-06-29 DIAGNOSIS — H04123 Dry eye syndrome of bilateral lacrimal glands: Secondary | ICD-10-CM | POA: Diagnosis not present

## 2023-06-29 DIAGNOSIS — H43393 Other vitreous opacities, bilateral: Secondary | ICD-10-CM | POA: Diagnosis not present

## 2023-06-29 DIAGNOSIS — H2513 Age-related nuclear cataract, bilateral: Secondary | ICD-10-CM | POA: Diagnosis not present

## 2023-06-29 DIAGNOSIS — H5211 Myopia, right eye: Secondary | ICD-10-CM | POA: Diagnosis not present

## 2023-07-19 ENCOUNTER — Other Ambulatory Visit: Payer: Self-pay | Admitting: Family Medicine

## 2023-07-19 DIAGNOSIS — Z1231 Encounter for screening mammogram for malignant neoplasm of breast: Secondary | ICD-10-CM

## 2023-08-08 DIAGNOSIS — Z713 Dietary counseling and surveillance: Secondary | ICD-10-CM | POA: Diagnosis not present

## 2023-08-10 DIAGNOSIS — Z713 Dietary counseling and surveillance: Secondary | ICD-10-CM | POA: Diagnosis not present

## 2023-08-15 DIAGNOSIS — Z713 Dietary counseling and surveillance: Secondary | ICD-10-CM | POA: Diagnosis not present

## 2023-08-17 DIAGNOSIS — Z713 Dietary counseling and surveillance: Secondary | ICD-10-CM | POA: Diagnosis not present

## 2023-08-22 DIAGNOSIS — Z713 Dietary counseling and surveillance: Secondary | ICD-10-CM | POA: Diagnosis not present

## 2023-08-23 ENCOUNTER — Ambulatory Visit: Payer: BC Managed Care – PPO

## 2023-08-24 DIAGNOSIS — Z713 Dietary counseling and surveillance: Secondary | ICD-10-CM | POA: Diagnosis not present

## 2023-08-25 ENCOUNTER — Ambulatory Visit
Admission: RE | Admit: 2023-08-25 | Discharge: 2023-08-25 | Disposition: A | Discharge status: Home / Self Care | Payer: BC Managed Care – PPO | Source: Ambulatory Visit | Attending: Family Medicine | Admitting: Family Medicine

## 2023-08-25 DIAGNOSIS — Z1231 Encounter for screening mammogram for malignant neoplasm of breast: Secondary | ICD-10-CM

## 2023-09-05 DIAGNOSIS — Z713 Dietary counseling and surveillance: Secondary | ICD-10-CM | POA: Diagnosis not present

## 2023-09-07 DIAGNOSIS — Z713 Dietary counseling and surveillance: Secondary | ICD-10-CM | POA: Diagnosis not present

## 2023-09-12 DIAGNOSIS — I10 Essential (primary) hypertension: Secondary | ICD-10-CM | POA: Diagnosis not present

## 2023-09-12 DIAGNOSIS — Z713 Dietary counseling and surveillance: Secondary | ICD-10-CM | POA: Diagnosis not present

## 2023-09-14 DIAGNOSIS — Z713 Dietary counseling and surveillance: Secondary | ICD-10-CM | POA: Diagnosis not present

## 2023-09-14 DIAGNOSIS — I10 Essential (primary) hypertension: Secondary | ICD-10-CM | POA: Diagnosis not present

## 2023-09-19 DIAGNOSIS — Z713 Dietary counseling and surveillance: Secondary | ICD-10-CM | POA: Diagnosis not present

## 2023-09-19 DIAGNOSIS — I10 Essential (primary) hypertension: Secondary | ICD-10-CM | POA: Diagnosis not present

## 2023-09-21 DIAGNOSIS — I10 Essential (primary) hypertension: Secondary | ICD-10-CM | POA: Diagnosis not present

## 2023-09-21 DIAGNOSIS — Z713 Dietary counseling and surveillance: Secondary | ICD-10-CM | POA: Diagnosis not present

## 2023-09-26 DIAGNOSIS — Z713 Dietary counseling and surveillance: Secondary | ICD-10-CM | POA: Diagnosis not present

## 2023-09-26 DIAGNOSIS — I10 Essential (primary) hypertension: Secondary | ICD-10-CM | POA: Diagnosis not present

## 2023-10-03 DIAGNOSIS — I10 Essential (primary) hypertension: Secondary | ICD-10-CM | POA: Diagnosis not present

## 2023-10-03 DIAGNOSIS — Z713 Dietary counseling and surveillance: Secondary | ICD-10-CM | POA: Diagnosis not present

## 2023-10-05 DIAGNOSIS — I10 Essential (primary) hypertension: Secondary | ICD-10-CM | POA: Diagnosis not present

## 2023-10-05 DIAGNOSIS — Z713 Dietary counseling and surveillance: Secondary | ICD-10-CM | POA: Diagnosis not present

## 2023-10-07 DIAGNOSIS — I10 Essential (primary) hypertension: Secondary | ICD-10-CM | POA: Diagnosis not present

## 2023-10-07 DIAGNOSIS — Z713 Dietary counseling and surveillance: Secondary | ICD-10-CM | POA: Diagnosis not present

## 2023-10-10 DIAGNOSIS — Z713 Dietary counseling and surveillance: Secondary | ICD-10-CM | POA: Diagnosis not present

## 2023-10-10 DIAGNOSIS — I10 Essential (primary) hypertension: Secondary | ICD-10-CM | POA: Diagnosis not present

## 2023-10-13 DIAGNOSIS — I10 Essential (primary) hypertension: Secondary | ICD-10-CM | POA: Diagnosis not present

## 2024-08-09 ENCOUNTER — Other Ambulatory Visit: Payer: Self-pay | Admitting: Family Medicine

## 2024-08-09 DIAGNOSIS — Z1231 Encounter for screening mammogram for malignant neoplasm of breast: Secondary | ICD-10-CM

## 2024-08-27 ENCOUNTER — Ambulatory Visit
Admission: RE | Admit: 2024-08-27 | Discharge: 2024-08-27 | Disposition: A | Discharge status: Home / Self Care | Source: Ambulatory Visit | Attending: Family Medicine | Admitting: Family Medicine

## 2024-08-27 DIAGNOSIS — Z1231 Encounter for screening mammogram for malignant neoplasm of breast: Secondary | ICD-10-CM
